# Patient Record
Sex: Female | Born: 1995 | Race: Black or African American | Hispanic: No | Marital: Single | Smoking: Never smoker
Health system: Southern US, Community
[De-identification: ages and names within clinical notes are randomized; demographics above are authoritative.]

## PROBLEM LIST (undated history)

## (undated) ENCOUNTER — Inpatient Hospital Stay (HOSPITAL_COMMUNITY): Payer: Self-pay

## (undated) DIAGNOSIS — Z789 Other specified health status: Secondary | ICD-10-CM

## (undated) DIAGNOSIS — R630 Anorexia: Secondary | ICD-10-CM

## (undated) DIAGNOSIS — K4090 Unilateral inguinal hernia, without obstruction or gangrene, not specified as recurrent: Secondary | ICD-10-CM

## (undated) HISTORY — PX: INDUCED ABORTION: SHX677

---
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
## 2016-01-24 LAB — POCT PREGNANCY, URINE: Preg Test, Ur: NEGATIVE

## 2016-04-03 ENCOUNTER — Encounter (HOSPITAL_COMMUNITY): Payer: Self-pay | Admitting: Emergency Medicine

## 2016-04-03 ENCOUNTER — Emergency Department (HOSPITAL_COMMUNITY)
Admission: EM | Admit: 2016-04-03 | Discharge: 2016-04-03 | Disposition: A | Discharge status: Home / Self Care | Payer: Medicaid Other | Attending: Emergency Medicine | Admitting: Emergency Medicine

## 2016-04-03 ENCOUNTER — Emergency Department (HOSPITAL_COMMUNITY): Payer: Medicaid Other

## 2016-04-03 DIAGNOSIS — M25532 Pain in left wrist: Secondary | ICD-10-CM | POA: Diagnosis present

## 2016-04-03 DIAGNOSIS — Z79899 Other long term (current) drug therapy: Secondary | ICD-10-CM | POA: Insufficient documentation

## 2016-04-03 DIAGNOSIS — M67432 Ganglion, left wrist: Secondary | ICD-10-CM

## 2016-04-03 NOTE — Discharge Instructions (Signed)
Use tylenol or motrin as needed for pain. Use ice or heat to the area as needed to help with pain and swelling. Follow up with the hand specialist in 1-2 weeks as needed for ongoing management of your wrist pain/ganglion cyst. Return to the ER for changes or worsening symptoms.   Ganglion Cyst A ganglion cyst is a noncancerous, fluid-filled lump that occurs near joints or tendons. The ganglion cyst grows out of a joint or the lining of a tendon. It most often develops in the hand or wrist, but it can also develop in the shoulder, elbow, hip, knee, ankle, or foot. The round or oval ganglion cyst can be the size of a pea or larger than a grape. Increased activity may enlarge the size of the cyst because more fluid starts to build up.  CAUSES It is not known what causes a ganglion cyst to grow. However, it may be related to:  Inflammation or irritation around the joint.  An injury.  Repetitive movements or overuse.  Arthritis. RISK FACTORS Risk factors include:  Being a woman.  Being age 29-50. SIGNS AND SYMPTOMS Symptoms may include:   A lump. This most often appears on the hand or wrist, but it can occur in other areas of the body.  Tingling.  Pain.  Numbness.  Muscle weakness.  Weak grip.  Less movement in a joint. DIAGNOSIS Ganglion cysts are most often diagnosed based on a physical exam. Your health care provider will feel the lump and may shine a light alongside it. If it is a ganglion cyst, a light often shines through it. Your health care provider may order an X-ray, ultrasound, or MRI to rule out other conditions. TREATMENT Ganglion cysts usually go away on their own without treatment. If pain or other symptoms are involved, treatment may be needed. Treatment is also needed if the ganglion cyst limits your movement or if it gets infected. Treatment may include:  Wearing a brace or splint on your wrist or finger.  Taking anti-inflammatory medicine.  Draining fluid  from the lump with a needle (aspiration).  Injecting a steroid into the joint.  Surgery to remove the ganglion cyst. HOME CARE INSTRUCTIONS  Do not press on the ganglion cyst, poke it with a needle, or hit it.  Take medicines only as directed by your health care provider.  Wear your brace or splint as directed by your health care provider.  Watch your ganglion cyst for any changes.  Keep all follow-up visits as directed by your health care provider. This is important. SEEK MEDICAL CARE IF:  Your ganglion cyst becomes larger or more painful.  You have increased redness, red streaks, or swelling.  You have pus coming from the lump.  You have weakness or numbness in the affected area.  You have a fever or chills.   This information is not intended to replace advice given to you by your health care provider. Make sure you discuss any questions you have with your health care provider.   Document Released: 09/01/2000 Document Revised: 09/25/2014 Document Reviewed: 02/17/2014 Elsevier Interactive Patient Education 2016 Elsevier Inc[employer]  [employer] Therapy Heat therapy can help ease sore, stiff, injured, and tight muscles and joints. Heat relaxes your muscles, which may help ease your pain.  RISKS AND COMPLICATIONS If you have any of the following conditions, do not use heat therapy unless your health care provider has approved:  Poor circulation.  Healing wounds or scarred skin in the area being treated.  Diabetes,  heart disease, or high blood pressure.  Not being able to feel (numbness) the area being treated.  Unusual swelling of the area being treated.  Active infections.  Blood clots.  Cancer.  Inability to communicate pain. This may include young children and people who have problems with their brain function (dementia).  Pregnancy. Heat therapy should only be used on old, pre-existing, or long-lasting (chronic) injuries. Do not use heat therapy on new injuries unless  directed by your health care provider. HOW TO USE HEAT THERAPY There are several different kinds of heat therapy, including:  Moist heat pack.  Warm water bath.  Hot water bottle.  Electric heating pad.  Heated gel pack.  Heated wrap.  Electric heating pad. Use the heat therapy method suggested by your health care provider. Follow your health care provider's instructions on when and how to use heat therapy. GENERAL HEAT THERAPY RECOMMENDATIONS  Do not sleep while using heat therapy. Only use heat therapy while you are awake.  Your skin may turn pink while using heat therapy. Do not use heat therapy if your skin turns red.  Do not use heat therapy if you have new pain.  High heat or long exposure to heat can cause burns. Be careful when using heat therapy to avoid burning your skin.  Do not use heat therapy on areas of your skin that are already irritated, such as with a rash or sunburn. SEEK MEDICAL CARE IF:  You have blisters, redness, swelling, or numbness.  You have new pain.  Your pain is worse. MAKE SURE YOU:  Understand these instructions.  Will watch your condition.  Will get help right away if you are not doing well or get worse.   This information is not intended to replace advice given to you by your health care provider. Make sure you discuss any questions you have with your health care provider.   Document Released: 11/27/2011 Document Revised: 09/25/2014 Document Reviewed: 10/28/2013 Elsevier Interactive Patient Education 2016 Elsevier Inc.  Cryotherapy Cryotherapy means treatment with cold. Ice or gel packs can be used to reduce both pain and swelling. Ice is the most helpful within the first 24 to 48 hours after an injury or flare-up from overusing a muscle or joint. Sprains, strains, spasms, burning pain, shooting pain, and aches can all be eased with ice. Ice can also be used when recovering from surgery. Ice is effective, has very few side effects,  and is safe for most people to use. PRECAUTIONS  Ice is not a safe treatment option for people with:  Raynaud phenomenon. This is a condition affecting small blood vessels in the extremities. Exposure to cold may cause your problems to return.  Cold hypersensitivity. There are many forms of cold hypersensitivity, including:  Cold urticaria. Red, itchy hives appear on the skin when the tissues begin to warm after being iced.  Cold erythema. This is a red, itchy rash caused by exposure to cold.  Cold hemoglobinuria. Red blood cells break down when the tissues begin to warm after being iced. The hemoglobin that carry oxygen are passed into the urine because they cannot combine with blood proteins fast enough.  Numbness or altered sensitivity in the area being iced. If you have any of the following conditions, do not use ice until you have discussed cryotherapy with your caregiver:  Heart conditions, such as arrhythmia, angina, or chronic heart disease.  High blood pressure.  Healing wounds or open skin in the area being iced.  Current  infections.  Rheumatoid arthritis.  Poor circulation.  Diabetes. Ice slows the blood flow in the region it is applied. This is beneficial when trying to stop inflamed tissues from spreading irritating chemicals to surrounding tissues. However, if you expose your skin to cold temperatures for too long or without the proper protection, you can damage your skin or nerves. Watch for signs of skin damage due to cold. HOME CARE INSTRUCTIONS Follow these tips to use ice and cold packs safely.  Place a dry or damp towel between the ice and skin. A damp towel will cool the skin more quickly, so you may need to shorten the time that the ice is used.  For a more rapid response, add gentle compression to the ice.  Ice for no more than 10 to 20 minutes at a time. The bonier the area you are icing, the less time it will take to get the benefits of ice.  Check  your skin after 5 minutes to make sure there are no signs of a poor response to cold or skin damage.  Rest 20 minutes or more between uses.  Once your skin is numb, you can end your treatment. You can test numbness by very lightly touching your skin. The touch should be so light that you do not see the skin dimple from the pressure of your fingertip. When using ice, most people will feel these normal sensations in this order: cold, burning, aching, and numbness.  Do not use ice on someone who cannot communicate their responses to pain, such as small children or people with dementia. HOW TO MAKE AN ICE PACK Ice packs are the most common way to use ice therapy. Other methods include ice massage, ice baths, and cryosprays. Muscle creams that cause a cold, tingly feeling do not offer the same benefits that ice offers and should not be used as a substitute unless recommended by your caregiver. To make an ice pack, do one of the following:  Place crushed ice or a bag of frozen vegetables in a sealable plastic bag. Squeeze out the excess air. Place this bag inside another plastic bag. Slide the bag into a pillowcase or place a damp towel between your skin and the bag.  Mix 3 parts water with 1 part rubbing alcohol. Freeze the mixture in a sealable plastic bag. When you remove the mixture from the freezer, it will be slushy. Squeeze out the excess air. Place this bag inside another plastic bag. Slide the bag into a pillowcase or place a damp towel between your skin and the bag. SEEK MEDICAL CARE IF:  You develop white spots on your skin. This may give the skin a blotchy (mottled) appearance.  Your skin turns blue or pale.  Your skin becomes waxy or hard.  Your swelling gets worse. MAKE SURE YOU:   Understand these instructions.  Will watch your condition.  Will get help right away if you are not doing well or get worse.   This information is not intended to replace advice given to you by your  health care provider. Make sure you discuss any questions you have with your health care provider.   Document Released: 05/01/2011 Document Revised: 09/25/2014 Document Reviewed: 05/01/2011 Elsevier Interactive Patient Education 2016 Elsevier Inc.   Wrist Pain There are many things that can cause wrist pain. Some common causes include:  An injury to the wrist area.  Overuse of the joint.  A condition that causes too much pressure to be put  on a nerve in the wrist (carpal tunnel syndrome).  Wear and tear of the joints that happens as a person gets older (osteoarthritis).  Other types of arthritis. Sometimes, the cause is not known. The pain often goes away when you follow instructions from your doctor about relieving pain at home. If your wrist pain does not go away, tests may need to be done to find the cause. HOME CARE Pay attention to any changes in your symptoms. Take these actions to help with your pain:  Rest your wrist for at least 48 hours or as told by your doctor.  If your doctor tells you to, put ice on the injured area:  Put ice in a plastic bag.  Place a towel between your skin and the bag.  Leave the ice on for 20 minutes, 2-3 times per day.  Keep your arm raised (elevated) above the level of your heart while you are sitting or lying down.  If a splint or elastic bandage has been put on the injured area:  Wear it as told by your doctor.  Take the splint or bandage off only as told by your doctor.  Loosen the splint or bandage if your fingers lose feeling (are numb) or have a tingling feeling, or if they turn cold or blue.  Take over-the-counter and prescription medicines only as told by your doctor.  Keep all follow-up visits as told by your doctor. This is important. GET HELP IF:  Your pain is not helped by treatment.  Your pain gets worse. GET HELP RIGHT AWAY IF:   Your fingers swell.  Your fingers turn white, very red, or cold and blue.  Your  fingers lose feeling or have a tingling feeling.  You have trouble moving your fingers.   This information is not intended to replace advice given to you by your health care provider. Make sure you discuss any questions you have with your health care provider.   Document Released: 02/21/2008 Document Revised: 05/26/2015 Document Reviewed: 01/20/2015 Elsevier Interactive Patient Education [employer]

## 2016-04-03 NOTE — ED Notes (Signed)
Pt states for the last 2 weeks she has been having left wrist pain and thinks she may have a cyst. Pt has +3 radial pulse with no swelling. Sensation and movement intact.

## 2016-04-03 NOTE — ED Provider Notes (Signed)
CSN: [id number]     Arrival date & time 04/03/16  1358 History  By signing my name below, I, Tanda Rockers, attest that this documentation has been prepared under the direction and in the presence of [name] [name]. Electronically Signed: Tanda Rockers, ED Scribe. 04/03/2016. 5:10 PM.   Chief Complaint  Patient presents with  . Wrist Pain   Patient is a 20 y.o. female presenting with wrist pain. The history is provided by the patient. No language interpreter was used.  Wrist Pain This is a new problem. The current episode started more than 1 week ago. The problem occurs daily. The problem has not changed since onset.Pertinent negatives include no chest pain, no abdominal pain and no shortness of breath. Exacerbated by: movement. Nothing relieves the symptoms. She has tried nothing for the symptoms. The treatment provided no relief.    HPI Comments: Lisa Cook is a 20 y.o. female who is right hand dominant presents to the Emergency Department complaining of gradual onset, intermittent, sharp, 7/10, left wrist pain radiating up left forearm x 2 weeks. Pt states that has felt a nodule in her left wrist for some time now and believes she may have a cyst. The bump has increased in size since onset, but has been stable for several months. The pain is exacerbated with movement. She has not taken anything for the pain. Denies fever, chills, chest pain, shortness of breath, abdominal pain, nausea, vomiting, diarrhea, constipation, dysuria, hematuria, weakness, numbness, tingling, warmth, bruising, skin changes, or any other associated symptoms.   Past Medical History  Diagnosis Date  . Inguinal hernia 11/2012    right  . Decreased appetite 11/29/2012   Past Surgical History  Procedure Laterality Date  . Inguinal hernia repair Right 12/05/2012    Procedure: RIGHT INGUINAL HERNIA REPAIR WITH LAP LOOK ON LEFT SIDE FOR POSSIBLE REPAIR;  Surgeon: Judie Petit. Leonia Corona, MD;  Location: [facility];  Service: Pediatrics;  Laterality: Right;  Rigth inguinal hernia repair with laparoscopic look on left (no hernia)   Family History  Problem Relation Age of Onset  . Asthma Brother   . Sickle cell trait Brother   . Diabetes Maternal Aunt   . Diabetes Maternal Grandmother   . Hypertension Maternal Grandmother   . Heart disease Maternal Grandmother   . Kidney disease Maternal Grandmother     renal failure  . Asthma Maternal Grandmother   . Sickle cell trait Sister    Social History  Substance Use Topics  . Smoking status: Never Smoker   . Smokeless tobacco: Never Used  . Alcohol Use: No   OB History    No data available     Review of Systems  Constitutional: Negative for fever and chills.  Respiratory: Negative for shortness of breath.   Cardiovascular: Negative for chest pain.  Gastrointestinal: Negative for nausea, vomiting, abdominal pain, diarrhea and constipation.  Genitourinary: Negative for dysuria and hematuria.  Musculoskeletal: Positive for arthralgias.       + nodule to left wrist  Skin: Negative for color change.  Allergic/Immunologic: Negative for immunocompromised state.  Neurological: Negative for weakness and numbness.  Psychiatric/Behavioral: Negative for confusion.  A complete 10 system review of systems was obtained and all systems are negative except as noted in the HPI and PMH.   Allergies  Review of patient's allergies indicates no known allergies.  Home Medications   Prior to Admission medications   Medication Sig Start Date End Date Taking? Authorizing Provider  amoxicillin (AMOXIL) 500 MG capsule Take 1 capsule (500 mg total) by mouth 3 (three) times daily. 12/08/14   Linna Hoff, MD  Prenatal Multivit-Min-Fe-FA (PRENATAL VITAMINS) 0.8 MG tablet Take 1 tablet by mouth daily. 10/09/13   Ree Shay, MD   BP 135/72 mmHg  Pulse 76  Temp(Src) 98.5 F (36.9 C)  Resp 20  SpO2 100%  LMP 04/03/2016   Physical Exam   Constitutional: She is oriented to person, place, and time. Vital signs are normal. She appears well-developed and well-nourished.  Non-toxic appearance. No distress.  Afebrile, nontoxic, NAD  HENT:  Head: Normocephalic and atraumatic.  Mouth/Throat: Mucous membranes are normal.  Eyes: Conjunctivae and EOM are normal. Right eye exhibits no discharge. Left eye exhibits no discharge.  Neck: Normal range of motion. Neck supple.  Cardiovascular: Normal rate and intact distal pulses.   Pulmonary/Chest: Effort normal. No respiratory distress.  Abdominal: Normal appearance. She exhibits no distension.  Musculoskeletal: Normal range of motion.       Left wrist: She exhibits tenderness. She exhibits normal range of motion, no bony tenderness, no swelling, no crepitus and no deformity.  Left wrist with FROM intact, with mild tenderness over the dorsum of the hand and wrist, with small ganglion cyst noted in the area of pain, no focal bony TTP, no crepitus or deformity, no swelling, no skin changes, strength and sensation grossly intact, distal pulses intact.  Neurological: She is alert and oriented to person, place, and time. She has normal strength. No sensory deficit.  Skin: Skin is warm, dry and intact. No rash noted.  Psychiatric: She has a normal mood and affect. Her behavior is normal.  Nursing note and vitals reviewed.   ED Course  Procedures (including critical care time)  DIAGNOSTIC STUDIES: Oxygen Saturation is 100% on RA, normal by my interpretation.    COORDINATION OF CARE: 5:00 PM-Discussed treatment plan which includes OTC pain medication and ice/heat with pt at bedside and pt agreed to plan.   Labs Review Labs Reviewed - No data to display  Imaging Review Dg Wrist Complete Left  04/03/2016  CLINICAL DATA:  Left wrist pain for 2 weeks. No known injury. Initial encounter. EXAM: LEFT WRIST - COMPLETE 3+ VIEW COMPARISON:  None. FINDINGS: There is no evidence of fracture or  dislocation. There is no evidence of arthropathy or other focal bone abnormality. Soft tissues are unremarkable. IMPRESSION: Normal exam. Electronically Signed   By: Drusilla Kanner M.D.   On: 04/03/2016 15:14   I have personally reviewed and evaluated these images and lab results as part of my medical decision-making.   EKG Interpretation None      MDM   Final diagnoses:  Left wrist pain  Ganglion cyst of wrist, left    20 y.o. female here with L wrist pain for several weeks, felt a ganglion cyst there that has been present for months. On exam, NVI with soft compartments, small ganglion cyst palpable and this is the location of pain, no other joint line or bony TTP. No skin changes to indicate infection or other etiologies. Likely just ganglion cyst. Discussed RICE, diminishing repetitive movements with wrist, and tylenol/motrin for pain. F/up with hand specialist in 1-2wks for ongoing management. Xray done in triage which was neg. Doubt need for further work up today. I explained the diagnosis and have given explicit precautions to return to the ER including for any other new or worsening symptoms. The patient understands and accepts the medical plan as it's  been dictated and I have answered their questions. Discharge instructions concerning home care and prescriptions have been given. The patient is STABLE and is discharged to home in good condition.   I personally performed the services described in this documentation, which was scribed in my presence. The recorded information has been reviewed and is accurate.  BP 135/72 mmHg  Pulse 76  Temp(Src) 98.5 F (36.9 C)  Resp 20  SpO2 100%  LMP 04/03/2016  No orders of the defined types were placed in this encounter.        [address] CourtMercedes Loachapokaamprubi-Soms, PA-C 04/03/16 1715  Lavera Guiseana Duo Liu, MD 04/04/16 [phone]

## 2016-04-03 NOTE — ED Notes (Signed)
Declined W/C at D/C and was escorted to lobby by RN. 

## 2016-12-16 ENCOUNTER — Encounter (HOSPITAL_COMMUNITY): Payer: Self-pay | Admitting: Emergency Medicine

## 2016-12-16 DIAGNOSIS — J028 Acute pharyngitis due to other specified organisms: Secondary | ICD-10-CM | POA: Insufficient documentation

## 2016-12-16 DIAGNOSIS — E876 Hypokalemia: Secondary | ICD-10-CM | POA: Insufficient documentation

## 2016-12-16 DIAGNOSIS — E860 Dehydration: Secondary | ICD-10-CM | POA: Diagnosis not present

## 2016-12-16 DIAGNOSIS — R509 Fever, unspecified: Secondary | ICD-10-CM | POA: Diagnosis present

## 2016-12-16 DIAGNOSIS — B9689 Other specified bacterial agents as the cause of diseases classified elsewhere: Secondary | ICD-10-CM | POA: Diagnosis not present

## 2016-12-16 MED ORDER — ACETAMINOPHEN 325 MG PO TABS
650.0000 mg | ORAL_TABLET | Freq: Once | ORAL | Status: AC | PRN
Start: 2016-12-16 — End: 2016-12-17
  Administered 2016-12-17: 650 mg via ORAL
  Filled 2016-12-16: qty 2

## 2016-12-16 NOTE — ED Triage Notes (Signed)
Pt reports sore throat and fever for the last 2 days. Pt also reporting pain into right ear. Pt denies taking any OTC medications prior to arrival.

## 2016-12-17 ENCOUNTER — Emergency Department (HOSPITAL_COMMUNITY)
Admission: EM | Admit: 2016-12-17 | Discharge: 2016-12-17 | Disposition: A | Discharge status: Home / Self Care | Payer: Medicaid Other | Attending: Emergency Medicine | Admitting: Emergency Medicine

## 2016-12-17 DIAGNOSIS — J028 Acute pharyngitis due to other specified organisms: Secondary | ICD-10-CM

## 2016-12-17 DIAGNOSIS — E876 Hypokalemia: Secondary | ICD-10-CM

## 2016-12-17 DIAGNOSIS — B9689 Other specified bacterial agents as the cause of diseases classified elsewhere: Secondary | ICD-10-CM

## 2016-12-17 DIAGNOSIS — E860 Dehydration: Secondary | ICD-10-CM

## 2016-12-17 LAB — CBC WITH DIFFERENTIAL/PLATELET
Basophils Absolute: 0 10*3/uL (ref 0.0–0.1)
Basophils Relative: 0 %
Eosinophils Absolute: 0 10*3/uL (ref 0.0–0.7)
Eosinophils Relative: 0 %
HCT: 34.9 % — ABNORMAL LOW (ref 36.0–46.0)
Hemoglobin: 11.3 g/dL — ABNORMAL LOW (ref 12.0–15.0)
Lymphocytes Relative: 10 %
Lymphs Abs: 1.1 10*3/uL (ref 0.7–4.0)
MCH: 27.8 pg (ref 26.0–34.0)
MCHC: 32.4 g/dL (ref 30.0–36.0)
MCV: 85.7 fL (ref 78.0–100.0)
Monocytes Absolute: 0.9 10*3/uL (ref 0.1–1.0)
Monocytes Relative: 8 %
Neutro Abs: 9 10*3/uL — ABNORMAL HIGH (ref 1.7–7.7)
Neutrophils Relative %: 82 %
Platelets: 340 10*3/uL (ref 150–400)
RBC: 4.07 MIL/uL (ref 3.87–5.11)
RDW: 14.1 % (ref 11.5–15.5)
WBC: 11 10*3/uL — ABNORMAL HIGH (ref 4.0–10.5)

## 2016-12-17 LAB — BASIC METABOLIC PANEL
Anion gap: 8 (ref 5–15)
BUN: 9 mg/dL (ref 6–20)
CO2: 24 mmol/L (ref 22–32)
Calcium: 8.9 mg/dL (ref 8.9–10.3)
Chloride: 106 mmol/L (ref 101–111)
Creatinine, Ser: 0.74 mg/dL (ref 0.44–1.00)
GFR calc Af Amer: >60 mL/min (ref >60)
GFR calc non Af Amer: >60 mL/min (ref >60)
Glucose, Bld: 116 mg/dL — ABNORMAL HIGH (ref 65–99)
Potassium: 3.3 mmol/L — ABNORMAL LOW (ref 3.5–5.1)
Sodium: 138 mmol/L (ref 135–145)

## 2016-12-17 LAB — MONONUCLEOSIS SCREEN: Mono Screen: NEGATIVE

## 2016-12-17 LAB — RAPID STREP SCREEN (MED CTR MEBANE ONLY): Streptococcus, Group A Screen (Direct): NEGATIVE

## 2016-12-17 MED ORDER — SODIUM CHLORIDE 0.9 % IV BOLUS (SEPSIS)
1000.0000 mL | INTRAVENOUS | Status: AC
  Administered 2016-12-17: 1000 mL via INTRAVENOUS

## 2016-12-17 MED ORDER — DEXAMETHASONE SODIUM PHOSPHATE 10 MG/ML IJ SOLN
10.0000 mg | Freq: Once | INTRAMUSCULAR | Status: AC
  Administered 2016-12-17: 10 mg via INTRAMUSCULAR
  Filled 2016-12-17: qty 1

## 2016-12-17 MED ORDER — PENICILLIN G BENZATHINE 1200000 UNIT/2ML IM SUSP
1.2000 10*6.[IU] | Freq: Once | INTRAMUSCULAR | Status: AC
Start: 2016-12-17 — End: 2016-12-17
  Administered 2016-12-17: 1.2 10*6.[IU] via INTRAMUSCULAR
  Filled 2016-12-17: qty 2

## 2016-12-17 MED ORDER — KETOROLAC TROMETHAMINE 60 MG/2ML IM SOLN
60.0000 mg | Freq: Once | INTRAMUSCULAR | Status: AC
  Administered 2016-12-17: 60 mg via INTRAMUSCULAR
  Filled 2016-12-17: qty 2

## 2016-12-17 MED ORDER — POTASSIUM CHLORIDE CRYS ER 20 MEQ PO TBCR
40.0000 meq | EXTENDED_RELEASE_TABLET | Freq: Once | ORAL | Status: AC
  Administered 2016-12-17: 40 meq via ORAL
  Filled 2016-12-17: qty 2

## 2016-12-17 NOTE — ED Provider Notes (Signed)
[facility]-EMERGENCY DEPT Provider Note   CSN: [id number] Arrival date & time: 12/16/16  2323   By signing my name below, I, Clovis Pu, attest that this documentation has been prepared under the direction and in the presence of  [employer], PA-C. Electronically Signed: Clovis Pu, ED Scribe. 12/17/16. 1:06 AM.   History   Chief Complaint Chief Complaint  Patient presents with  . Sore Throat  . Fever    HPI Comments:  Lisa Cook is a 21 y.o. female who presents to the Emergency Department complaining of acute onset, persistent sore throat x 2 days. Her sore throat is worse with swallowing. Pt also reports rhinorrhea, chills and subjective fevers. No alleviating factors noted. Pt denies a cough or any other associated symptoms. She also denies a hx of mononucleosis. No other complaints noted. Patient reports somewhat decreased by mouth intake due to pain however able to handle secretions.  The history is provided by the patient and medical records. No language interpreter was used.    Past Medical History:  Diagnosis Date  . Decreased appetite 11/29/2012  . Inguinal hernia 11/2012   right    There are no active problems to display for this patient.   Past Surgical History:  Procedure Laterality Date  . INGUINAL HERNIA REPAIR Right 12/05/2012   Procedure: RIGHT INGUINAL HERNIA REPAIR WITH LAP LOOK ON LEFT SIDE FOR POSSIBLE REPAIR;  Surgeon: Judie Petit. Leonia Corona, MD;  Location: [facility];  Service: Pediatrics;  Laterality: Right;  Rigth inguinal hernia repair with laparoscopic look on left (no hernia)    OB History    No data available       Home Medications    Prior to Admission medications   Not on File    Family History Family History  Problem Relation Age of Onset  . Diabetes Maternal Aunt   . Diabetes Maternal Grandmother   . Hypertension Maternal Grandmother   . Heart disease Maternal Grandmother   . Kidney disease Maternal  Grandmother     renal failure  . Asthma Maternal Grandmother   . Asthma Brother   . Sickle cell trait Brother   . Sickle cell trait Sister     Social History Social History  Substance Use Topics  . Smoking status: Never Smoker  . Smokeless tobacco: Never Used  . Alcohol use No     Allergies   Patient has no known allergies.   Review of Systems Review of Systems  Constitutional: Positive for chills and fever.  HENT: Positive for rhinorrhea and sore throat.   Respiratory: Negative for cough.    Physical Exam Updated Vital Signs BP (!) 109/96 (BP Location: Left Arm)   Pulse (!) 105   Temp (!) 103.1 F (39.5 C) (Oral)   Resp 20   Ht  (1.651 m)   Wt 175 lb (79.4 kg)   LMP 12/16/2016   SpO2 100%   BMI 29.12 kg/m   Physical Exam  Constitutional: She appears well-developed and well-nourished. No distress.  HENT:  Head: Normocephalic and atraumatic.  Right Ear: Tympanic membrane, external ear and ear canal normal.  Left Ear: Tympanic membrane, external ear and ear canal normal.  Nose: Nose normal. No mucosal edema or rhinorrhea.  Mouth/Throat: Uvula is midline and mucous membranes are normal. Mucous membranes are not dry. No trismus in the jaw. No uvula swelling. Oropharyngeal exudate, posterior oropharyngeal edema and posterior oropharyngeal erythema present. No tonsillar abscesses.  Posterior oropharynx with erythema, edema and  exudate on the tonsils  Eyes: Conjunctivae are normal.  Neck: Normal range of motion, full passive range of motion without pain and phonation normal. No tracheal tenderness, no spinous process tenderness and no muscular tenderness present. No neck rigidity. No erythema and normal range of motion present. No Brudzinski's sign and no Kernig's sign noted.  Range of motion without pain  No midline or paraspinal tenderness Normal phonation No stridor Handling secretions without difficulty No nuchal rigidity or meningeal signs  Cardiovascular:  Regular rhythm and normal heart sounds.  Tachycardia present.   Pulses:      Radial pulses are 2+ on the right side, and 2+ on the left side.  Pulmonary/Chest: Effort normal and breath sounds normal. No stridor. No respiratory distress. She has no decreased breath sounds. She has no wheezes.  Equal chest expansion, clear and equal breath sounds without focal wheezes, rhonchi or rales  Musculoskeletal: Normal range of motion.  Lymphadenopathy:       Head (right side): Submandibular and tonsillar adenopathy present. No submental, no preauricular, no posterior auricular and no occipital adenopathy present.       Head (left side): Submandibular and tonsillar adenopathy present. No submental, no preauricular, no posterior auricular and no occipital adenopathy present.    She has cervical adenopathy (bilateral anterior cervical ).       Right cervical: No superficial cervical, no deep cervical and no posterior cervical adenopathy present.      Left cervical: No superficial cervical, no deep cervical and no posterior cervical adenopathy present.  Neurological: She is alert.  Alert and oriented Moves all extremities without ataxia  Skin: Skin is warm and dry. She is not diaphoretic.  Psychiatric: She has a normal mood and affect.  Nursing note and vitals reviewed.    ED Treatments / Results  DIAGNOSTIC STUDIES:  Oxygen Saturation is 100% on RA, normal by my interpretation.    COORDINATION OF CARE:  1:05 AM Discussed treatment plan with pt at bedside and pt agreed to plan.  Labs (all labs ordered are listed, but only abnormal results are displayed) Labs Reviewed  CBC WITH DIFFERENTIAL/PLATELET - Abnormal; Notable for the following:       Result Value   WBC 11.0 (*)    Hemoglobin 11.3 (*)    HCT 34.9 (*)    Neutro Abs 9.0 (*)    All other components within normal limits  BASIC METABOLIC PANEL - Abnormal; Notable for the following:    Potassium 3.3 (*)    Glucose, Bld 116 (*)    All  other components within normal limits  RAPID STREP SCREEN (NOT AT [facility])  CULTURE, GROUP A STREP [facility])  MONONUCLEOSIS SCREEN     Procedures Procedures (including critical care time)  Medications Ordered in ED Medications  acetaminophen (TYLENOL) tablet 650 mg (650 mg Oral Given 12/17/16 0002)  sodium chloride 0.9 % bolus 1,000 mL (0 mLs Intravenous Stopped 12/17/16 0325)  ketorolac (TORADOL) injection 60 mg (60 mg Intramuscular Given 12/17/16 0152)  dexamethasone (DECADRON) injection 10 mg (10 mg Intramuscular Given 12/17/16 0151)  potassium chloride SA (K-DUR,KLOR-CON) CR tablet 40 mEq (40 mEq Oral Given 12/17/16 0311)  penicillin g benzathine (BICILLIN LA) 1200000 UNIT/2ML injection 1.2 Million Units (1.2 Million Units Intramuscular Given 12/17/16 0311)     Initial Impression / Assessment and Plan / ED Course  I have reviewed the triage vital signs and the nursing notes.  Pertinent labs & imaging results that were available during my care of the  patient were reviewed by me and considered in my medical decision making (see chart for details).     Pt rapid strep test negative. Pt is tolerating secretions. Presentation not concerning for peritonsillar abscess or spread of infection to deep spaces of the throat; patent airway. Concern for bacterial pharyngitis. Patient treated with penicillin, Decadron and Toradol. She's had some relief with her pain. She's been able to tolerate fluids here in the emergency room.  Specific return precautions discussed. Recommended PCP follow up. Pt appears safe for discharge.    Final Clinical Impressions(s) / ED Diagnoses   Final diagnoses:  Bacterial pharyngitis  Hypokalemia  Dehydration    New Prescriptions There are no discharge medications for this patient.   I personally performed the services described in this documentation, which was scribed in my presence. The recorded information has been reviewed and is accurate.     Dahlia Client Kimerly Rowand,  PA-C 12/17/16 1610    Tomasita Crumble, MD 12/17/16 [phone]

## 2016-12-17 NOTE — Discharge Instructions (Signed)
1. Medications: ibuprofen and tylenol for fever and pain control, usual home medications 2. Treatment: rest, drink plenty of fluids,  3. Follow Up: Please followup with your primary doctor in 2-3 days for discussion of your diagnoses and further evaluation after today's visit; if you do not have a primary care doctor use the resource guide provided to find one; Please return to the ER for worsening symptoms, lateralizing pain, dehydration or other concerns

## 2016-12-18 ENCOUNTER — Emergency Department (HOSPITAL_COMMUNITY): Payer: Medicaid Other

## 2016-12-18 ENCOUNTER — Emergency Department (HOSPITAL_COMMUNITY)
Admission: EM | Admit: 2016-12-18 | Discharge: 2016-12-19 | Disposition: A | Discharge status: Home / Self Care | Payer: Medicaid Other | Attending: Emergency Medicine | Admitting: Emergency Medicine

## 2016-12-18 ENCOUNTER — Encounter (HOSPITAL_COMMUNITY): Payer: Self-pay | Admitting: Emergency Medicine

## 2016-12-18 DIAGNOSIS — J36 Peritonsillar abscess: Secondary | ICD-10-CM | POA: Insufficient documentation

## 2016-12-18 DIAGNOSIS — J029 Acute pharyngitis, unspecified: Secondary | ICD-10-CM | POA: Diagnosis present

## 2016-12-18 LAB — I-STAT BETA HCG BLOOD, ED (MC, WL, AP ONLY): I-stat hCG, quantitative: <5 m[IU]/mL (ref <5)

## 2016-12-18 LAB — CBC WITH DIFFERENTIAL/PLATELET
Basophils Absolute: 0 10*3/uL (ref 0.0–0.1)
Basophils Relative: 0 %
Eosinophils Absolute: 0 10*3/uL (ref 0.0–0.7)
Eosinophils Relative: 0 %
HCT: 38.3 % (ref 36.0–46.0)
Hemoglobin: 12.2 g/dL (ref 12.0–15.0)
Lymphocytes Relative: 27 %
Lymphs Abs: 3.8 10*3/uL (ref 0.7–4.0)
MCH: 27.9 pg (ref 26.0–34.0)
MCHC: 31.9 g/dL (ref 30.0–36.0)
MCV: 87.4 fL (ref 78.0–100.0)
Monocytes Absolute: 1.1 10*3/uL — ABNORMAL HIGH (ref 0.1–1.0)
Monocytes Relative: 8 %
Neutro Abs: 9 10*3/uL — ABNORMAL HIGH (ref 1.7–7.7)
Neutrophils Relative %: 65 %
Platelets: 409 10*3/uL — ABNORMAL HIGH (ref 150–400)
RBC: 4.38 MIL/uL (ref 3.87–5.11)
RDW: 14.2 % (ref 11.5–15.5)
WBC: 13.9 10*3/uL — ABNORMAL HIGH (ref 4.0–10.5)

## 2016-12-18 LAB — BASIC METABOLIC PANEL
Anion gap: 8 (ref 5–15)
BUN: 11 mg/dL (ref 6–20)
CO2: 25 mmol/L (ref 22–32)
Calcium: 9.1 mg/dL (ref 8.9–10.3)
Chloride: 109 mmol/L (ref 101–111)
Creatinine, Ser: 0.89 mg/dL (ref 0.44–1.00)
GFR calc Af Amer: >60 mL/min (ref >60)
GFR calc non Af Amer: >60 mL/min (ref >60)
Glucose, Bld: 88 mg/dL (ref 65–99)
Potassium: 3.4 mmol/L — ABNORMAL LOW (ref 3.5–5.1)
Sodium: 142 mmol/L (ref 135–145)

## 2016-12-18 MED ORDER — IBUPROFEN 800 MG PO TABS: 800.0000 mg | ORAL_TABLET | Freq: Three times a day (TID) | ORAL | 0 refills | Status: DC

## 2016-12-18 MED ORDER — CLINDAMYCIN HCL 150 MG PO CAPS: 300.0000 mg | ORAL_CAPSULE | Freq: Three times a day (TID) | ORAL | 0 refills | Status: AC

## 2016-12-18 MED ORDER — CLINDAMYCIN PHOSPHATE 600 MG/50ML IV SOLN
600.0000 mg | Freq: Once | INTRAVENOUS | Status: AC
  Administered 2016-12-18: 600 mg via INTRAVENOUS
  Filled 2016-12-18: qty 50

## 2016-12-18 MED ORDER — KETOROLAC TROMETHAMINE 30 MG/ML IJ SOLN
30.0000 mg | Freq: Once | INTRAMUSCULAR | Status: AC
  Administered 2016-12-18: 30 mg via INTRAVENOUS
  Filled 2016-12-18: qty 1

## 2016-12-18 MED ORDER — SODIUM CHLORIDE 0.9 % IV BOLUS (SEPSIS)
1000.0000 mL | Freq: Once | INTRAVENOUS | Status: AC
  Administered 2016-12-18: 1000 mL via INTRAVENOUS

## 2016-12-18 MED ORDER — IOPAMIDOL (ISOVUE-300) INJECTION 61%
75.0000 mL | Freq: Once | INTRAVENOUS | Status: AC | PRN
  Administered 2016-12-18: 75 mL via INTRAVENOUS
  Filled 2016-12-18: qty 75

## 2016-12-18 MED ORDER — HYDROCODONE-ACETAMINOPHEN 7.5-325 MG/15ML PO SOLN: 15.0000 mL | Freq: Four times a day (QID) | ORAL | 0 refills | Status: DC | PRN

## 2016-12-18 MED ORDER — ACETAMINOPHEN 325 MG PO TABS
650.0000 mg | ORAL_TABLET | Freq: Once | ORAL | Status: AC
  Administered 2016-12-18: 650 mg via ORAL
  Filled 2016-12-18: qty 2

## 2016-12-18 MED ORDER — DEXAMETHASONE SODIUM PHOSPHATE 10 MG/ML IJ SOLN
10.0000 mg | Freq: Once | INTRAMUSCULAR | Status: AC
  Administered 2016-12-18: 10 mg via INTRAMUSCULAR
  Filled 2016-12-18: qty 1

## 2016-12-18 MED ORDER — OXYCODONE-ACETAMINOPHEN 5-325 MG PO TABS
2.0000 | ORAL_TABLET | Freq: Once | ORAL | Status: AC
  Administered 2016-12-18: 2 via ORAL
  Filled 2016-12-18: qty 2

## 2016-12-18 NOTE — ED Notes (Signed)
This RN stuck pt 2 times to attempt IV access with no success.

## 2016-12-18 NOTE — ED Provider Notes (Signed)
[facility]-EMERGENCY DEPT Provider Note   CSN: [id number] Arrival date & time: 12/18/16  1639  By signing my name below, I, Cynda Acres, attest that this documentation has been prepared under the direction and in the presence of Sharen Heck, [place]. Electronically Signed: Cynda Acres, ED scribe. 12/18/16. 7:35 PM.  History   Chief Complaint Chief Complaint  Patient presents with  . Sore Throat    HPI Comments: Lisa Cook is a 21 y.o. female with no pertinent medical history, who presents to the Emergency Department complaining of persistent, constant sore throat (worse on the right) that began 3 days ago. Patient was seen in ED two days ago, strep swab and culture negative. She was treated with PCN IM x 1.  Patient notes her symptoms did not improve and have progressively worsened.  Patient reports associated right-sided neck swelling, chills, trouble swallowing, trouble opening jaw, and a headache. No relief with penicillin shot. Patient denies a surgical hx of tonsillectomy. Patient denies any fever, emesis, cough, or rash.  Patient reports minimal relief with tylenol.   The history is provided by the patient. No language interpreter was used.    Past Medical History:  Diagnosis Date  . Decreased appetite 11/29/2012  . Inguinal hernia 11/2012   right    There are no active problems to display for this patient.   Past Surgical History:  Procedure Laterality Date  . INGUINAL HERNIA REPAIR Right 12/05/2012   Procedure: RIGHT INGUINAL HERNIA REPAIR WITH LAP LOOK ON LEFT SIDE FOR POSSIBLE REPAIR;  Surgeon: Judie Petit. Leonia Corona, MD;  Location: [facility];  Service: Pediatrics;  Laterality: Right;  Rigth inguinal hernia repair with laparoscopic look on left (no hernia)    OB History    No data available       Home Medications    Prior to Admission medications   Medication Sig Start Date End Date Taking? Authorizing Provider  clindamycin (CLEOCIN) 150 MG  capsule Take 2 capsules (300 mg total) by mouth 3 (three) times daily. 12/18/16 12/28/16  Liberty Handy, PA-C  HYDROcodone-acetaminophen (HYCET) 7.5-325 mg/15 ml solution Take 15 mLs by mouth 4 (four) times daily as needed for moderate pain. 12/18/16 12/18/17  Liberty Handy, PA-C  ibuprofen (ADVIL,MOTRIN) 800 MG tablet Take 1 tablet (800 mg total) by mouth 3 (three) times daily. 12/18/16   Liberty Handy, PA-C    Family History Family History  Problem Relation Age of Onset  . Diabetes Maternal Aunt   . Diabetes Maternal Grandmother   . Hypertension Maternal Grandmother   . Heart disease Maternal Grandmother   . Kidney disease Maternal Grandmother     renal failure  . Asthma Maternal Grandmother   . Asthma Brother   . Sickle cell trait Brother   . Sickle cell trait Sister     Social History Social History  Substance Use Topics  . Smoking status: Never Smoker  . Smokeless tobacco: Never Used  . Alcohol use No     Allergies   Patient has no known allergies.   Review of Systems Review of Systems  Constitutional: Negative for fever.  HENT: Positive for sore throat and trouble swallowing. Negative for facial swelling.        +Anterior neck swelling +Trismus  Skin: Negative for rash.     Physical Exam Updated Vital Signs BP 138/82 (BP Location: Right Arm)   Pulse 64   Temp 98.3 F (36.8 C) (Oral)   Resp 18   LMP  12/16/2016   SpO2 99%   Physical Exam  Constitutional: She is oriented to person, place, and time. She appears well-developed and well-nourished. She is cooperative. No distress.  HENT:  Head: Normocephalic and atraumatic.  Nose: Nose normal.  Mouth/Throat: Mucous membranes are normal. There is trismus in the jaw. Posterior oropharyngeal edema and posterior oropharyngeal erythema present.  +Right submandibular lymph node tenderness.  +Right anterior neck tenderness, questionable edema of right anterior neck  Posterior oropharynx is difficult to see, I  cannot visualize uvula or tonsils +Trismus No facial edema, erythema or erythema. No sublingual edema or tenderness.  Soft palate flat without tenderness.  No pooling of oral secretions.  Phonation normal, no hot potato voice.  Maxilla and mandible nontender. Mastoids without edema, erythema or tenderness.    Eyes: Conjunctivae and EOM are normal. No scleral icterus.  Cardiovascular: Normal rate, regular rhythm, normal heart sounds and intact distal pulses.   No murmur heard. Pulmonary/Chest: Effort normal and breath sounds normal. She has no wheezes.  Neurological: She is alert and oriented to person, place, and time.  Skin: Skin is warm and dry. Capillary refill takes less than 2 seconds.  Psychiatric: She has a normal mood and affect. Her behavior is normal. Judgment and thought content normal.  Nursing note and vitals reviewed.    ED Treatments / Results  DIAGNOSTIC STUDIES: Oxygen Saturation is 100% on RA, normal by my interpretation.    COORDINATION OF CARE: 7:34 PM Discussed treatment plan with pt at bedside and pt agreed to plan, which includes a neck CT.   Labs (all labs ordered are listed, but only abnormal results are displayed) Labs Reviewed  BASIC METABOLIC PANEL - Abnormal; Notable for the following:       Result Value   Potassium 3.4 (*)    All other components within normal limits  CBC WITH DIFFERENTIAL/PLATELET - Abnormal; Notable for the following:    WBC 13.9 (*)    Platelets 409 (*)    Neutro Abs 9.0 (*)    Monocytes Absolute 1.1 (*)    All other components within normal limits  I-STAT BETA HCG BLOOD, ED ([facility], [facility], [facility] ONLY)    EKG  EKG Interpretation None       Radiology Ct Soft Tissue Neck W Contrast  Result Date: 12/18/2016 CLINICAL DATA:  Initial evaluation for acute sore throat. EXAM: CT NECK WITH CONTRAST TECHNIQUE: Multidetector CT imaging of the neck was performed using the standard protocol following the bolus administration of intravenous  contrast. CONTRAST:  75mL ISOVUE-300 IOPAMIDOL (ISOVUE-300) INJECTION 61% COMPARISON:  None. FINDINGS: Pharynx and larynx: Oral cavity within normal limits. No acute abnormality about the dentition. Palatine tonsils enlarged and hyperenhancing, suggesting acute tonsillitis. Superimposed right the tonsillar/peritonsillar abscess measures 11 x 6 x 16 mm (series 5, image 37). Induration/inflammatory stranding within the adjacent right parapharyngeal fat. Nasopharynx normal. Epiglottis normal. Vallecula clear. Retropharyngeal soft tissues within normal limits. Remainder of the hypopharynx and supraglottic larynx within normal limits. True cords symmetric and normal. Subglottic airway clear. Salivary glands: Salivary glands within normal limits. Thyroid: Thyroid normal. Lymph nodes: Mildly enlarged right level II lymph nodes measure up to 13 mm, likely reactive. Additional mildly prominent right level IV nodes are mildly enlarged up to 13 mm. These may be reactive in nature. Vascular: Normal intravascular enhancement seen throughout the neck. Limited intracranial: Unremarkable. Visualized orbits: Visualized globes and orbital soft tissues within normal limits. Mastoids and visualized paranasal sinuses: Visualized paranasal sinuses and mastoid air cells  are clear. Middle ear cavities are clear. Skeleton: No acute osseous abnormality. No worrisome lytic or blastic osseous lesions. Upper chest: Visualized upper mediastinum within normal limits. Partially visualized lungs are clear. Other: None. IMPRESSION: 1. Findings consistent with acute tonsillitis. Superimposed 11 x 6 x 16 mm right tonsillar/peritonsillar abscess as above. 2. Mildly enlarged right-sided cervical adenopathy is above, likely reactive. Electronically Signed   By: Rise Mu M.D.   On: 12/18/2016 21:27    Procedures Procedures (including critical care time)  Medications Ordered in ED Medications  clindamycin (CLEOCIN) IVPB 600 mg (not  administered)  ketorolac (TORADOL) 30 MG/ML injection 30 mg (not administered)  oxyCODONE-acetaminophen (PERCOCET/ROXICET) 5-325 MG per tablet 2 tablet (not administered)  dexamethasone (DECADRON) injection 10 mg (10 mg Intramuscular Given 12/18/16 1804)  acetaminophen (TYLENOL) tablet 650 mg (650 mg Oral Given 12/18/16 1804)  sodium chloride 0.9 % bolus 1,000 mL (1,000 mLs Intravenous New Bag/Given 12/18/16 2118)  iopamidol (ISOVUE-300) 61 % injection 75 mL (75 mLs Intravenous Contrast Given 12/18/16 2042)     Initial Impression / Assessment and Plan / ED Course  I have reviewed the triage vital signs and the nursing notes.  Pertinent labs & imaging results that were available during my care of the patient were reviewed by me and considered in my medical decision making (see chart for details).  Clinical Course as of Dec 19 2246  Mon Dec 18, 2016  2131 IMPRESSION: 1. Findings consistent with acute tonsillitis. Superimposed 11 x 6 x 16 mm right tonsillar/peritonsillar abscess as above. 2. Mildly enlarged right-sided cervical adenopathy is above, likely reactive. CT Soft Tissue Neck W Contrast [CG]  2233 I-stat hCG, quantitative: <5.0 [CG]  2233 WBC: (!) 13.9 [CG]  2233 Hemoglobin: 12.2 [CG]  2233 Creatinine: 0.89 [CG]  2233 Temp: 98.3 F (36.8 C) [CG]  2233 Pulse Rate: 71 [CG]  2233 BP: 128/76 [CG]  2233 Resp: 19 [CG]  2233 SpO2: 99 % [CG]    Clinical Course User Index [CG] Liberty Handy, PA-C   CT scan showed 11x6x16 mm right tonsillar abscess. +trismus on exam, afebrile, no respiratory distress, patient speaking in full sentences controlling oral secretions. Posterior oropharynx very difficult to visualize. Unable to see uvula or tonsils. Leukocytosis at 13.9. Patient received 1L IVF, tylenol, decadron x 1.  Will consult ENT for recommendations and disposition.   10:30PM: ENT (Dr. Annalee Genta) recommends clindamycin IV x 1 in ED. Discharge with clindamycin, ibuprofen, lortab elixir.   Patient to call ENT clinic if symptoms do not start to improve in 48 hours. Discussed plan with patient and relative at bedside, who agree with plan. Patient given clindamycin, toradol, percocet in ED prior to discharge.   Patient, ED treatment and discharge plan was discussed with supervising physician who is agreeable with plan.   Final Clinical Impressions(s) / ED Diagnoses   Final diagnoses:  Sore throat  Peritonsillar abscess    New Prescriptions New Prescriptions   CLINDAMYCIN (CLEOCIN) 150 MG CAPSULE    Take 2 capsules (300 mg total) by mouth 3 (three) times daily.   HYDROCODONE-ACETAMINOPHEN (HYCET) 7.5-325 MG/15 ML SOLUTION    Take 15 mLs by mouth 4 (four) times daily as needed for moderate pain.   IBUPROFEN (ADVIL,MOTRIN) 800 MG TABLET    Take 1 tablet (800 mg total) by mouth 3 (three) times daily.   I personally performed the services described in this documentation, which was scribed in my presence. The recorded information has been reviewed and is accurate.  Liberty Handy, PA-C 12/18/16 2248    Shaune Pollack, MD 12/20/16 [phone]

## 2016-12-18 NOTE — Discharge Instructions (Signed)
Your CT scan showed a right peritonsillar abscess.  Please take prescribed antibiotics until completion, you may open capsule and pour powder medicine in juice or apple sauce if your throat pain makes it difficult to swallow capsules.  Use Lortab Elixir as prescribed. Take 800 mg ibuprofen every 8 hours.  Drink plenty of cold water to sooth pain and decrease inflammation.   Return to ED if your symptoms worsen, you develop fever, have difficulty breathing or start drooling.   Your symptoms should improve in the next 48 hours.  Contact ENT provider, if your symptoms have not improved in 48 hours.

## 2016-12-18 NOTE — ED Notes (Signed)
Provider at bedside

## 2016-12-18 NOTE — ED Provider Notes (Signed)
MSE was initiated and I personally evaluated the patient and placed orders (if any) at  5:45 PM on December 18, 2016.  The patient appears stable so that the remainder of the MSE may be completed by another provider.  Patient presents to fast track area with continued sore throat after being seen in the emergency department 2 days ago. Patient had a negative rapid strep test in the emergency department. She was treated for bacterial pharyngitis with penicillin in the emergency department. Patient reports her sore throats has continued. She reports it's worse on the right side. She has been drinking, however less than usual. She reports it hurts tremendously to drink or eat. On exam the patient is afebrile nontoxic appearing. She has slight trismus. It is somewhat difficult to visualize her posterior oropharynx. Using a tongue depressor patient does appear to have large tonsils. Concern for possible peritonsillar abscess in the right side. Patient is tolerating her secretions, however she is spitting up her secretions at times. No stridor. No drooling.  Patient was provided with Decadron. Will have him start an IV, give her a fluid bolus and obtain a CT of her neck soft tissues to rule out peritonsillar abscess. We'll move patient to an acute side room.   Vitals:   12/18/16 1701  BP: 109/68  Pulse: 71  Resp: 11  Temp: 98.3 F (36.8 C)  TempSrc: Oral  SpO2: 100%        Everlene Farrier, PA-C 12/18/16 1831    Shaune Pollack, MD 12/20/16 [phone]

## 2016-12-18 NOTE — ED Notes (Signed)
Bed: WA03 Expected date:  Expected time:  Means of arrival:  Comments: Triage 6 

## 2016-12-18 NOTE — ED Triage Notes (Signed)
Pt c/o right facial swelling, sore throat x 3 days, states she was diagnosed with bacterial pharyngitis and given antibiotic injection in ED yesterday, which initially improved symptoms, but now symptoms worsening.

## 2016-12-18 NOTE — ED Notes (Signed)
Family at bedside. 

## 2016-12-18 NOTE — ED Notes (Signed)
Attempted to get an IV, no success; patient needs IV for CT scan

## 2016-12-19 LAB — CULTURE, GROUP A STREP (THRC)

## 2017-02-27 ENCOUNTER — Inpatient Hospital Stay (HOSPITAL_COMMUNITY): Payer: Medicaid Other

## 2017-02-27 ENCOUNTER — Encounter (HOSPITAL_COMMUNITY): Payer: Self-pay | Admitting: Emergency Medicine

## 2017-02-27 ENCOUNTER — Inpatient Hospital Stay (HOSPITAL_COMMUNITY)
Admission: AD | Admit: 2017-02-27 | Discharge: 2017-02-27 | Disposition: A | Discharge status: Home / Self Care | Payer: Medicaid Other | Source: Ambulatory Visit | Attending: Obstetrics and Gynecology | Admitting: Obstetrics and Gynecology

## 2017-02-27 ENCOUNTER — Ambulatory Visit (HOSPITAL_COMMUNITY)
Admission: EM | Admit: 2017-02-27 | Discharge: 2017-02-27 | Disposition: A | Discharge status: Nursing Home (Includes ICF) | Payer: Medicaid Other

## 2017-02-27 ENCOUNTER — Encounter (HOSPITAL_COMMUNITY): Payer: Self-pay | Admitting: *Deleted

## 2017-02-27 DIAGNOSIS — R102 Pelvic and perineal pain: Secondary | ICD-10-CM

## 2017-02-27 DIAGNOSIS — F17200 Nicotine dependence, unspecified, uncomplicated: Secondary | ICD-10-CM | POA: Insufficient documentation

## 2017-02-27 DIAGNOSIS — O99332 Smoking (tobacco) complicating pregnancy, second trimester: Secondary | ICD-10-CM | POA: Insufficient documentation

## 2017-02-27 DIAGNOSIS — N760 Acute vaginitis: Secondary | ICD-10-CM | POA: Diagnosis not present

## 2017-02-27 DIAGNOSIS — O26891 Other specified pregnancy related conditions, first trimester: Secondary | ICD-10-CM | POA: Diagnosis not present

## 2017-02-27 DIAGNOSIS — Z3A01 Less than 8 weeks gestation of pregnancy: Secondary | ICD-10-CM | POA: Diagnosis not present

## 2017-02-27 DIAGNOSIS — B9689 Other specified bacterial agents as the cause of diseases classified elsewhere: Secondary | ICD-10-CM

## 2017-02-27 HISTORY — DX: Other specified health status: Z78.9

## 2017-02-27 LAB — WET PREP, GENITAL
Sperm: NONE SEEN
Trich, Wet Prep: NONE SEEN
Yeast Wet Prep HPF POC: NONE SEEN

## 2017-02-27 LAB — URINALYSIS, ROUTINE W REFLEX MICROSCOPIC
Bilirubin Urine: NEGATIVE
Glucose, UA: NEGATIVE mg/dL
Hgb urine dipstick: NEGATIVE
Ketones, ur: NEGATIVE mg/dL
Nitrite: NEGATIVE
Protein, ur: NEGATIVE mg/dL
Specific Gravity, Urine: 1.025 (ref 1.005–1.030)
pH: 6 (ref 5.0–8.0)

## 2017-02-27 LAB — CBC
HCT: 34.7 % — ABNORMAL LOW (ref 36.0–46.0)
Hemoglobin: 11.4 g/dL — ABNORMAL LOW (ref 12.0–15.0)
MCH: 27.9 pg (ref 26.0–34.0)
MCHC: 32.9 g/dL (ref 30.0–36.0)
MCV: 85 fL (ref 78.0–100.0)
Platelets: 368 10*3/uL (ref 150–400)
RBC: 4.08 MIL/uL (ref 3.87–5.11)
RDW: 14.1 % (ref 11.5–15.5)
WBC: 5.5 10*3/uL (ref 4.0–10.5)

## 2017-02-27 LAB — URINALYSIS, MICROSCOPIC (REFLEX)

## 2017-02-27 LAB — HCG, QUANTITATIVE, PREGNANCY: hCG, Beta Chain, Quant, S: 2185 m[IU]/mL — ABNORMAL HIGH (ref <5)

## 2017-02-27 LAB — POCT PREGNANCY, URINE: Preg Test, Ur: POSITIVE — AB

## 2017-02-27 MED ORDER — METRONIDAZOLE 500 MG PO TABS: 500.0000 mg | ORAL_TABLET | Freq: Two times a day (BID) | ORAL | 0 refills | Status: AC

## 2017-02-27 NOTE — MAU Provider Note (Signed)
Chief Complaint: vaginal burning; Abdominal Cramping; and Possible Pregnancy   First Provider Initiated Contact with Patient 02/27/17 1944        SUBJECTIVE HPI: Lisa Cook is a 21 y.o. G1P0 at [redacted]w[redacted]d by LMP who presents to maternity admissions reporting vaginal irritation and burning which started yesterday.  Also has some lower abdominal cramping.  Was sent here from Urgent care and ED.  Not aware she is pregnant. She denies vaginal bleeding, vaginal itching/burning, urinary symptoms, h/a, dizziness, n/v, or fever/chills.    Abdominal Cramping  This is a new problem. The current episode started yesterday. The onset quality is gradual. The problem occurs intermittently. The problem has been unchanged. The pain is located in the suprapubic region, LLQ and RLQ. The pain is moderate. The quality of the pain is cramping. The abdominal pain does not radiate. Pertinent negatives include no constipation, diarrhea, fever, headaches, myalgias, nausea or vomiting. Nothing aggravates the pain. The pain is relieved by nothing. She has tried nothing for the symptoms.  Possible Pregnancy  This is a new problem. The current episode started today. The problem occurs constantly. The problem has been unchanged. Associated symptoms include abdominal pain. Pertinent negatives include no fever, headaches, myalgias, nausea or vomiting. Nothing aggravates the symptoms. She has tried nothing for the symptoms.   RN Note: +vaginal burning Started yesterday 10/10 pain Watery vaginal discharge--denies odor  +Lower abdominal cramping Going on x1 week Rating pain 5/10  LMP 01/11/17 +HPT  Patient states she just got over a cold and having sinus issues still.   Went to urgent care today for vaginal burning. States was sent here  Past Medical History:  Diagnosis Date  . Decreased appetite 11/29/2012  . Inguinal hernia 11/2012   right  . Medical history non-contributory    Past Surgical History:  Procedure  Laterality Date  . INGUINAL HERNIA REPAIR Right 12/05/2012   Procedure: RIGHT INGUINAL HERNIA REPAIR WITH LAP LOOK ON LEFT SIDE FOR POSSIBLE REPAIR;  Surgeon: Judie Petit. Leonia Corona, MD;  Location: [facility];  Service: Pediatrics;  Laterality: Right;  Rigth inguinal hernia repair with laparoscopic look on left (no hernia)   Social History   Social History  . Marital status: Single    Spouse name: N/A  . Number of children: N/A  . Years of education: N/A   Occupational History  . Not on file.   Social History Main Topics  . Smoking status: Current Some Day Smoker  . Smokeless tobacco: Never Used  . Alcohol use No  . Drug use: No  . Sexual activity: Yes    Birth control/ protection: None   Other Topics Concern  . Not on file   Social History Narrative  . No narrative on file   No current facility-administered medications on file prior to encounter.    No current outpatient prescriptions on file prior to encounter.   No Known Allergies  I have reviewed patient's Past Medical Hx, Surgical Hx, Family Hx, Social Hx, medications and allergies.   ROS:  Review of Systems  Constitutional: Negative for fever.  Gastrointestinal: Positive for abdominal pain. Negative for constipation, diarrhea, nausea and vomiting.  Musculoskeletal: Negative for myalgias.  Neurological: Negative for headaches.   Review of Systems  Other systems negative   Physical Exam  Physical Exam Patient Vitals for the past 24 hrs:  BP Temp Temp src Pulse Resp SpO2 Weight  02/27/17 1821 (!) 147/96 (!) 100.4 F (38 C) Oral 89 18 100 %  168 lb 1.3 oz (76.2 kg)   Constitutional: Well-developed, well-nourished female in no acute distress.  Cardiovascular: normal rate Respiratory: normal effort GI: Abd soft, non-tender. Pos BS x 4 MS: Extremities nontender, no edema, normal ROM Neurologic: Alert and oriented x 4.  GU: Neg CVAT.  PELVIC EXAM: Cervix pink, visually closed, without lesion,  scant white creamy discharge, vaginal walls and external genitalia normal Bimanual exam: Cervix 0/long/high, firm, anterior, neg CMT, uterus nontender, nonenlarged, adnexa without tenderness, enlargement, or mass    LAB RESULTS Results for orders placed or performed during the hospital encounter of 02/27/17 (from the past 24 hour(s))  Urinalysis, Routine w reflex microscopic     Status: Abnormal   Collection Time: 02/27/17  6:21 PM  Result Value Ref Range   Color, Urine YELLOW YELLOW   APPearance HAZY (A) CLEAR   Specific Gravity, Urine 1.025 1.005 - 1.030   pH 6.0 5.0 - 8.0   Glucose, UA NEGATIVE NEGATIVE mg/dL   Hgb urine dipstick NEGATIVE NEGATIVE   Bilirubin Urine NEGATIVE NEGATIVE   Ketones, ur NEGATIVE NEGATIVE mg/dL   Protein, ur NEGATIVE NEGATIVE mg/dL   Nitrite NEGATIVE NEGATIVE   Leukocytes, UA SMALL (A) NEGATIVE  Urinalysis, Microscopic (reflex)     Status: Abnormal   Collection Time: 02/27/17  6:21 PM  Result Value Ref Range   RBC / HPF 0-5 0 - 5 RBC/hpf   WBC, UA 6-30 0 - 5 WBC/hpf   Bacteria, UA FEW (A) NONE SEEN   Squamous Epithelial / LPF 0-5 (A) NONE SEEN   Mucous PRESENT   Pregnancy, urine POC     Status: Abnormal   Collection Time: 02/27/17  6:29 PM  Result Value Ref Range   Preg Test, Ur POSITIVE (A) NEGATIVE  Wet prep, genital     Status: Abnormal   Collection Time: 02/27/17  7:55 PM  Result Value Ref Range   Yeast Wet Prep HPF POC NONE SEEN NONE SEEN   Trich, Wet Prep NONE SEEN NONE SEEN   Clue Cells Wet Prep HPF POC PRESENT (A) NONE SEEN   WBC, Wet Prep HPF POC MODERATE (A) NONE SEEN   Sperm NONE SEEN   CBC     Status: Abnormal   Collection Time: 02/27/17  8:08 PM  Result Value Ref Range   WBC 5.5 4.0 - 10.5 K/uL   RBC 4.08 3.87 - 5.11 MIL/uL   Hemoglobin 11.4 (L) 12.0 - 15.0 g/dL   HCT 16.1 (L) 09.6 - 04.5 %   MCV 85.0 78.0 - 100.0 fL   MCH 27.9 26.0 - 34.0 pg   MCHC 32.9 30.0 - 36.0 g/dL   RDW 40.9 81.1 - 91.4 %   Platelets 368 150 - 400  K/uL    Ref. Range 02/27/2017 20:08  HCG, Beta Chain, Quant, S Latest Ref Range: <5 mIU/mL 2,185 (H)       IMAGING US Ob Comp Less 14 Wks  Result Date: 02/27/2017 CLINICAL DATA:  Cramping for 1 week EXAM: OBSTETRIC <14 WK [place] AND TRANSVAGINAL OB US TECHNIQUE: Both transabdominal and transvaginal ultrasound examinations were performed for complete evaluation of the gestation as well as the maternal uterus, adnexal regions, and pelvic cul-de-sac. Transvaginal technique was performed to assess early pregnancy. COMPARISON:  None. FINDINGS: Intrauterine gestational sac: Probable intrauterine gestational sac. Yolk sac:  Not visualized Embryo:  Not visualized MSD: 7.7  mm   5 w   3  d Maternal uterus/adnexae: Ovaries are within normal limits. The right ovary  measures 3.8 x 1.8 by 2 cm. The left ovary measures 2.7 x 1.7 x 1.6 cm. No free fluid. IMPRESSION: Probable early intrauterine gestational sac, but no yolk sac, fetal pole, or cardiac activity yet visualized. Recommend follow-up quantitative B-HCG levels and follow-up US in 14 days to assess viability. This recommendation follows SRU consensus guidelines: Diagnostic Criteria for Nonviable Pregnancy Early in the First Trimester. Malva Limes Engl J Med 2013; 409:8119-14; 369:1443-51. Electronically Signed   By: Jasmine PangKim  Fujinaga M.D.   On: 02/27/2017 20:46   [place] Ob Transvaginal  Result Date: 02/27/2017 CLINICAL DATA:  Cramping for 1 week EXAM: OBSTETRIC <14 WK US AND TRANSVAGINAL OB US TECHNIQUE: Both transabdominal and transvaginal ultrasound examinations were performed for complete evaluation of the gestation as well as the maternal uterus, adnexal regions, and pelvic cul-de-sac. Transvaginal technique was performed to assess early pregnancy. COMPARISON:  None. FINDINGS: Intrauterine gestational sac: Probable intrauterine gestational sac. Yolk sac:  Not visualized Embryo:  Not visualized MSD: 7.7  mm   5 w   3  d Maternal uterus/adnexae: Ovaries are within normal limits. The right  ovary measures 3.8 x 1.8 by 2 cm. The left ovary measures 2.7 x 1.7 x 1.6 cm. No free fluid. IMPRESSION: Probable early intrauterine gestational sac, but no yolk sac, fetal pole, or cardiac activity yet visualized. Recommend follow-up quantitative B-HCG levels and follow-up US in 14 days to assess viability. This recommendation follows SRU consensus guidelines: Diagnostic Criteria for Nonviable Pregnancy Early in the First Trimester. Malva Limes Engl J Med 2013; 782:9562-13; 369:1443-51. Electronically Signed   By: Jasmine PangKim  Fujinaga M.D.   On: 02/27/2017 20:46     MAU Management/MDM: Ordered usual first trimester r/o ectopic labs.   Pelvic exam and cultures done Will check baseline Ultrasound to rule out ectopic.  This bleeding/pain can represent a normal pregnancy with bleeding, spontaneous abortion or even an ectopic which can be life-threatening.  The process as listed above helps to determine which of these is present.  DIscussed findings. Recommend repeat HCG in 2 days. Then repeat US in 7-10 days. Will treat BV,  Not clear if that is what is causing the burning.   ASSESSMENT 1. Pelvic pain affecting pregnancy in first trimester, antepartum   2. Pelvic pain affecting pregnancy in first trimester, antepartum   3.   Pregnancy of unknown location 4.     Bacterial vaginosis  PLAN Discharge home Plan to repeat HCG level in 48 hours in clinic per 11:00 am schedule Will repeat  Ultrasound in about 7-10 days if HCG levels double appropriately  Ectopic precautions   Pt stable at time of discharge. Encouraged to return here or to other Urgent Care/ED if she develops worsening of symptoms, increase in pain, fever, or other concerning symptoms.    Wynelle BourgeoisMarie Williams CNM, MSN Certified Nurse-Midwife 02/27/2017  8:47 PM

## 2017-02-27 NOTE — Discharge Instructions (Signed)
Abdominal Pain During Pregnancy °Abdominal pain is common in pregnancy. Most of the time, it does not cause harm. There are many causes of abdominal pain. Some causes are more serious than others and sometimes the cause is not known. Abdominal pain can be a sign that something is very wrong with the pregnancy or the pain may have nothing to do with the pregnancy. Always tell your health care provider if you have any abdominal pain. °Follow these instructions at home: °· Do not have sex or put anything in your vagina until your symptoms go away completely. °· Watch your abdominal pain for any changes. °· Get plenty of rest until your pain improves. °· Drink enough fluid to keep your urine clear or pale yellow. °· Take over-the-counter or prescription medicines only as told by your health care provider. °· Keep all follow-up visits as told by your health care provider. This is important. °Contact a health care provider if: °· You have a fever. °· Your pain gets worse or you have cramping. °· Your pain continues after resting. °Get help right away if: °· You are bleeding, leaking fluid, or passing tissue from the vagina. °· You have vomiting or diarrhea that does not go away. °· You have painful or bloody urination. °· You notice a decrease in your baby's movements. °· You feel very weak or faint. °· You have shortness of breath. °· You develop a severe headache with abdominal pain. °· You have abnormal vaginal discharge with abdominal pain. °This information is not intended to replace advice given to you by your health care provider. Make sure you discuss any questions you have with your health care provider. °Document Released: 09/04/2005 Document Revised: 06/15/2016 Document Reviewed: 04/03/2013 °Elsevier Interactive Patient Education © 2018 [employer]. ° °First Trimester of Pregnancy °The first trimester of pregnancy is from week 1 until the end of week 13 (months 1 through 3). A week after a sperm fertilizes an  egg, the egg will implant on the wall of the uterus. This embryo will begin to develop into a baby. Genes from you and your partner will form the baby. The female genes will determine whether the baby will be a boy or a girl. At 6-8 weeks, the eyes and face will be formed, and the heartbeat can be seen on ultrasound. At the end of 12 weeks, all the baby's organs will be formed. °Now that you are pregnant, you will want to do everything you can to have a healthy baby. Two of the most important things are to get good prenatal care and to follow your health care provider's instructions. Prenatal care is all the medical care you receive before the baby's birth. This care will help prevent, find, and treat any problems during the pregnancy and childbirth. °Body changes during your first trimester °Your body goes through many changes during pregnancy. The changes vary from woman to woman. °· You may gain or lose a couple of pounds at first. °· You may feel sick to your stomach (nauseous) and you may throw up (vomit). If the vomiting is uncontrollable, call your health care provider. °· You may tire easily. °· You may develop headaches that can be relieved by medicines. All medicines should be approved by your health care provider. °· You may urinate more often. Painful urination may mean you have a bladder infection. °· You may develop heartburn as a result of your pregnancy. °· You may develop constipation because certain hormones are causing the muscles   that push stool through your intestines to slow down. °· You may develop hemorrhoids or swollen veins (varicose veins). °· Your breasts may begin to grow larger and become tender. Your nipples may stick out more, and the tissue that surrounds them (areola) may become darker. °· Your gums may bleed and may be sensitive to brushing and flossing. °· Dark spots or blotches (chloasma, mask of pregnancy) may develop on your face. This will likely fade after the baby is  born. °· Your menstrual periods will stop. °· You may have a loss of appetite. °· You may develop cravings for certain kinds of food. °· You may have changes in your emotions from day to day, such as being excited to be pregnant or being concerned that something may go wrong with the pregnancy and baby. °· You may have more vivid and strange dreams. °· You may have changes in your hair. These can include thickening of your hair, rapid growth, and changes in texture. Some women also have hair loss during or after pregnancy, or hair that feels dry or thin. Your hair will most likely return to normal after your baby is born. ° °What to expect at prenatal visits °During a routine prenatal visit: °· You will be weighed to make sure you and the baby are growing normally. °· Your blood pressure will be taken. °· Your abdomen will be measured to track your baby's growth. °· The fetal heartbeat will be listened to between weeks 10 and 14 of your pregnancy. °· Test results from any previous visits will be discussed. ° °Your health care provider may ask you: °· How you are feeling. °· If you are feeling the baby move. °· If you have had any abnormal symptoms, such as leaking fluid, bleeding, severe headaches, or abdominal cramping. °· If you are using any tobacco products, including cigarettes, chewing tobacco, and electronic cigarettes. °· If you have any questions. ° °Other tests that may be performed during your first trimester include: °· Blood tests to find your blood type and to check for the presence of any previous infections. The tests will also be used to check for low iron levels (anemia) and protein on red blood cells (Rh antibodies). Depending on your risk factors, or if you previously had diabetes during pregnancy, you may have tests to check for high blood sugar that affects pregnant women (gestational diabetes). °· Urine tests to check for infections, diabetes, or protein in the urine. °· An ultrasound to  confirm the proper growth and development of the baby. °· Fetal screens for spinal cord problems (spina bifida) and Down syndrome. °· HIV (human immunodeficiency virus) testing. Routine prenatal testing includes screening for HIV, unless you choose not to have this test. °· You may need other tests to make sure you and the baby are doing well. ° °Follow these instructions at home: °Medicines °· Follow your health care provider's instructions regarding medicine use. Specific medicines may be either safe or unsafe to take during pregnancy. °· Take a prenatal vitamin that contains at least 600 micrograms (mcg) of folic acid. °· If you develop constipation, try taking a stool softener if your health care provider approves. °Eating and drinking °· Eat a balanced diet that includes fresh fruits and vegetables, whole grains, good sources of protein such as meat, eggs, or tofu, and low-fat dairy. Your health care provider will help you determine the amount of weight gain that is right for you. °· Avoid raw meat and uncooked cheese.   These carry germs that can cause birth defects in the baby. °· Eating four or five small meals rather than three large meals a day may help relieve nausea and vomiting. If you start to feel nauseous, eating a few soda crackers can be helpful. Drinking liquids between meals, instead of during meals, also seems to help ease nausea and vomiting. °· Limit foods that are high in fat and processed sugars, such as fried and sweet foods. °· To prevent constipation: °? Eat foods that are high in fiber, such as fresh fruits and vegetables, whole grains, and beans. °? Drink enough fluid to keep your urine clear or pale yellow. °Activity °· Exercise only as directed by your health care provider. Most women can continue their usual exercise routine during pregnancy. Try to exercise for 30 minutes at least 5 days a week. Exercising will help you: °? Control your weight. °? Stay in shape. °? Be prepared for  labor and delivery. °· Experiencing pain or cramping in the lower abdomen or lower back is a good sign that you should stop exercising. Check with your health care provider before continuing with normal exercises. °· Try to avoid standing for long periods of time. Move your legs often if you must stand in one place for a long time. °· Avoid heavy lifting. °· Wear low-heeled shoes and practice good posture. °· You may continue to have sex unless your health care provider tells you not to. °Relieving pain and discomfort °· Wear a good support bra to relieve breast tenderness. °· Take warm sitz baths to soothe any pain or discomfort caused by hemorrhoids. Use hemorrhoid cream if your health care provider approves. °· Rest with your legs elevated if you have leg cramps or low back pain. °· If you develop varicose veins in your legs, wear support hose. Elevate your feet for 15 minutes, 3-4 times a day. Limit salt in your diet. °Prenatal care °· Schedule your prenatal visits by the twelfth week of pregnancy. They are usually scheduled monthly at first, then more often in the last 2 months before delivery. °· Write down your questions. Take them to your prenatal visits. °· Keep all your prenatal visits as told by your health care provider. This is important. °Safety °· Wear your seat belt at all times when driving. °· Make a list of emergency phone numbers, including numbers for family, friends, the hospital, and police and fire departments. °General instructions °· Ask your health care provider for a referral to a local prenatal education class. Begin classes no later than the beginning of month 6 of your pregnancy. °· Ask for help if you have counseling or nutritional needs during pregnancy. Your health care provider can offer advice or refer you to specialists for help with various needs. °· Do not use hot tubs, steam rooms, or saunas. °· Do not douche or use tampons or scented sanitary pads. °· Do not cross your legs  for long periods of time. °· Avoid cat litter boxes and soil used by cats. These carry germs that can cause birth defects in the baby and possibly loss of the fetus by miscarriage or stillbirth. °· Avoid all smoking, herbs, alcohol, and medicines not prescribed by your health care provider. Chemicals in these products affect the formation and growth of the baby. °· Do not use any products that contain nicotine or tobacco, such as cigarettes and e-cigarettes. If you need help quitting, ask your health care provider. You may receive counseling support and   other resources to help you quit. °· Schedule a dentist appointment. At home, brush your teeth with a soft toothbrush and be gentle when you floss. °Contact a health care provider if: °· You have dizziness. °· You have mild pelvic cramps, pelvic pressure, or nagging pain in the abdominal area. °· You have persistent nausea, vomiting, or diarrhea. °· You have a bad smelling vaginal discharge. °· You have pain when you urinate. °· You notice increased swelling in your face, hands, legs, or ankles. °· You are exposed to fifth disease or chickenpox. °· You are exposed to German measles (rubella) and have never had it. °Get help right away if: °· You have a fever. °· You are leaking fluid from your vagina. °· You have spotting or bleeding from your vagina. °· You have severe abdominal cramping or pain. °· You have rapid weight gain or loss. °· You vomit blood or material that looks like coffee grounds. °· You develop a severe headache. °· You have shortness of breath. °· You have any kind of trauma, such as from a fall or a car accident. °Summary °· The first trimester of pregnancy is from week 1 until the end of week 13 (months 1 through 3). °· Your body goes through many changes during pregnancy. The changes vary from woman to woman. °· You will have routine prenatal visits. During those visits, your health care provider will examine you, discuss any test results you  may have, and talk with you about how you are feeling. °This information is not intended to replace advice given to you by your health care provider. Make sure you discuss any questions you have with your health care provider. °Document Released: 08/29/2001 Document Revised: 08/16/2016 Document Reviewed: 08/16/2016 °Elsevier Interactive Patient Education © 2017 Elsevier Inc. ° °

## 2017-02-27 NOTE — ED Triage Notes (Addendum)
[facility] a [facility] of vaginal itching and burning and lower abdominal pain with dysuria x 3 days.

## 2017-02-27 NOTE — MAU Note (Signed)
+  vaginal burning Started yesterday 10/10 pain Watery vaginal discharge--denies odor  +Lower abdominal cramping Going on x1 week Rating pain 5/10  LMP 01/11/17 +HPT  Patient states she just got over a cold and having sinus issues still.   Went to urgent care today for vaginal burning. States was sent here.

## 2017-02-28 ENCOUNTER — Telehealth (HOSPITAL_COMMUNITY): Payer: Self-pay

## 2017-02-28 ENCOUNTER — Other Ambulatory Visit: Payer: Self-pay | Admitting: Certified Nurse Midwife

## 2017-02-28 ENCOUNTER — Encounter: Payer: Self-pay | Admitting: Advanced Practice Midwife

## 2017-02-28 DIAGNOSIS — O98819 Other maternal infectious and parasitic diseases complicating pregnancy, unspecified trimester: Secondary | ICD-10-CM

## 2017-02-28 DIAGNOSIS — A749 Chlamydial infection, unspecified: Secondary | ICD-10-CM | POA: Insufficient documentation

## 2017-02-28 LAB — HIV ANTIBODY (ROUTINE TESTING W REFLEX): HIV Screen 4th Generation wRfx: NONREACTIVE

## 2017-02-28 LAB — GC/CHLAMYDIA PROBE AMP (~~LOC~~) NOT AT ARMC
Chlamydia: POSITIVE — AB
Neisseria Gonorrhea: NEGATIVE

## 2017-02-28 MED ORDER — AZITHROMYCIN 250 MG PO TABS: 1000.0000 mg | ORAL_TABLET | Freq: Once | ORAL | 0 refills | Status: AC

## 2017-02-28 NOTE — Telephone Encounter (Signed)
Pt called back after receiving message. Verified DOB. Pt aware her lab results during [facility] showed she was positive for Chlamydia. Verified pharmacy with patient. Pt aware a prescription (Azithromycin 1 gram x 1 dose) will be called into her pharmacy for treatment of her positive results. She will take the pills as directed. Patient aware once treatment is received, abstain from intercourse x 2 weeks. Also, she is aware to inform her partner because they will also need to be treated. Communicable Disease Report faxed to health department.

## 2017-02-28 NOTE — Telephone Encounter (Signed)
1st attempt to contact patient regarding patient's STD lab results from her last visit. Left message on patient's voice mail to call back at her convenience. 

## 2017-03-01 ENCOUNTER — Telehealth (HOSPITAL_COMMUNITY): Payer: Self-pay

## 2017-03-01 ENCOUNTER — Encounter (HOSPITAL_COMMUNITY): Payer: Self-pay | Admitting: *Deleted

## 2017-03-01 ENCOUNTER — Telehealth: Payer: Self-pay | Admitting: General Practice

## 2017-03-01 ENCOUNTER — Inpatient Hospital Stay (HOSPITAL_COMMUNITY)
Admission: AD | Admit: 2017-03-01 | Discharge: 2017-03-01 | Disposition: A | Discharge status: Home / Self Care | Payer: Medicaid Other | Source: Ambulatory Visit | Attending: Obstetrics & Gynecology | Admitting: Obstetrics & Gynecology

## 2017-03-01 ENCOUNTER — Ambulatory Visit: Payer: Medicaid Other

## 2017-03-01 DIAGNOSIS — O26891 Other specified pregnancy related conditions, first trimester: Secondary | ICD-10-CM | POA: Diagnosis present

## 2017-03-01 DIAGNOSIS — O98811 Other maternal infectious and parasitic diseases complicating pregnancy, first trimester: Secondary | ICD-10-CM | POA: Insufficient documentation

## 2017-03-01 DIAGNOSIS — R109 Unspecified abdominal pain: Secondary | ICD-10-CM | POA: Diagnosis present

## 2017-03-01 DIAGNOSIS — O039 Complete or unspecified spontaneous abortion without complication: Secondary | ICD-10-CM | POA: Insufficient documentation

## 2017-03-01 DIAGNOSIS — O209 Hemorrhage in early pregnancy, unspecified: Secondary | ICD-10-CM | POA: Diagnosis present

## 2017-03-01 DIAGNOSIS — Z3A01 Less than 8 weeks gestation of pregnancy: Secondary | ICD-10-CM | POA: Insufficient documentation

## 2017-03-01 DIAGNOSIS — O99331 Smoking (tobacco) complicating pregnancy, first trimester: Secondary | ICD-10-CM | POA: Insufficient documentation

## 2017-03-01 DIAGNOSIS — A749 Chlamydial infection, unspecified: Secondary | ICD-10-CM | POA: Diagnosis not present

## 2017-03-01 DIAGNOSIS — F17200 Nicotine dependence, unspecified, uncomplicated: Secondary | ICD-10-CM | POA: Diagnosis not present

## 2017-03-01 DIAGNOSIS — O034 Incomplete spontaneous abortion without complication: Secondary | ICD-10-CM

## 2017-03-01 LAB — URINALYSIS, ROUTINE W REFLEX MICROSCOPIC
Bilirubin Urine: NEGATIVE
Glucose, UA: NEGATIVE mg/dL
Ketones, ur: 20 mg/dL — AB
Nitrite: NEGATIVE
Protein, ur: 100 mg/dL — AB
Specific Gravity, Urine: 1.024 (ref 1.005–1.030)
pH: 5 (ref 5.0–8.0)

## 2017-03-01 LAB — CBC
HCT: 36.7 % (ref 36.0–46.0)
Hemoglobin: 12.2 g/dL (ref 12.0–15.0)
MCH: 27.9 pg (ref 26.0–34.0)
MCHC: 33.2 g/dL (ref 30.0–36.0)
MCV: 84 fL (ref 78.0–100.0)
Platelets: 328 10*3/uL (ref 150–400)
RBC: 4.37 MIL/uL (ref 3.87–5.11)
RDW: 13.9 % (ref 11.5–15.5)
WBC: 7.2 10*3/uL (ref 4.0–10.5)

## 2017-03-01 LAB — HCG, QUANTITATIVE, PREGNANCY: hCG, Beta Chain, Quant, S: 944 m[IU]/mL — ABNORMAL HIGH (ref <5)

## 2017-03-01 MED ORDER — ONDANSETRON 4 MG PO TBDP
4.0000 mg | ORAL_TABLET | Freq: Once | ORAL | Status: AC
  Administered 2017-03-01: 4 mg via ORAL
  Filled 2017-03-01: qty 1

## 2017-03-01 MED ORDER — AZITHROMYCIN 250 MG PO TABS
1000.0000 mg | ORAL_TABLET | Freq: Once | ORAL | Status: AC
  Administered 2017-03-01: 1000 mg via ORAL
  Filled 2017-03-01: qty 4

## 2017-03-01 NOTE — Telephone Encounter (Signed)
Pt called. Verified DOB. Pt states that she "threw up her medicine" and wanted to know "will I be okay?" put pt on hold to look up information. Pt hung up the phone before I could finish looking her information up.

## 2017-03-01 NOTE — Telephone Encounter (Signed)
Pt called from work. Verified Name and DOB. Stated that she threw up her medicine. "there was pink in the throw up." Put pt on hold and went to talk to Wynelle BourgeoisMarie Williams, CNM who had seen her this morning. While speaking with Wynelle BourgeoisMarie Williams, CNM, the pt hung up the phone.

## 2017-03-01 NOTE — Discharge Instructions (Signed)

## 2017-03-01 NOTE — Telephone Encounter (Signed)
Left message for patient on voicemail to give our office a call in regards to lab appointment on 03/08/17 at 11am.

## 2017-03-01 NOTE — MAU Note (Addendum)
About 0630 awoke to abd cramps and vag bleeding. Have used one pad this am. Few small clots in toilet. Was to have labs in clinic at 1100 today but missed appt

## 2017-03-01 NOTE — MAU Provider Note (Signed)
Chief Complaint:  Vaginal Bleeding and Abdominal Cramping  Lisa Cook is  21 y.o. G2P0.  Patient's last menstrual period was 01/11/2017. Her pregnancy status is positive (7wk). She presents with diffuse 9/10 abdominal cramping and vaginal bleeding. The pain woke her up last night. She has been feeling this pain intermittently for 5 days, but it became constant today. This morning she went to the bathroom, noticed heavy bleeding (dark red with small clots), and notes she soaked through a pad in one hour. Was spotting yesterday, but bleeding became heavy today. Endorses nausea, diarrhea.   Obstetrical/Gynecological History: G2P0 (therapeutic abortion) Diagnosed with chlamydia on 6/12 (not yet treated)  Past Medical History: Past Medical History:  Diagnosis Date  . Decreased appetite 11/29/2012  . Inguinal hernia 11/2012   right  . Medical history non-contributory    Past Surgical History: Past Surgical History:  Procedure Laterality Date  . INGUINAL HERNIA REPAIR Right 12/05/2012   Procedure: RIGHT INGUINAL HERNIA REPAIR WITH LAP LOOK ON LEFT SIDE FOR POSSIBLE REPAIR;  Surgeon: Judie PetitM. Leonia CoronaShuaib Farooqui, MD;  Location: [facility];  Service: Pediatrics;  Laterality: Right;  Rigth inguinal hernia repair with laparoscopic look on left (no hernia)   Family History: Family History  Problem Relation Age of Onset  . Diabetes Maternal Aunt   . Diabetes Maternal Grandmother   . Hypertension Maternal Grandmother   . Heart disease Maternal Grandmother   . Kidney disease Maternal Grandmother        renal failure  . Asthma Maternal Grandmother   . Asthma Brother   . Sickle cell trait Brother   . Sickle cell trait Sister    Social History: Social History  Substance Use Topics  . Smoking status: Current Some Day Smoker  . Smokeless tobacco: Never Used  . Alcohol use No   Allergies: No Known Allergies  Prescriptions Prior to Admission  Medication Sig Dispense Refill Last Dose   . metroNIDAZOLE (FLAGYL) 500 MG tablet Take 1 tablet (500 mg total) by mouth 2 (two) times daily. 14 tablet 0 02/28/2017 at Unknown time   Review of Systems (+) abdominal pain (cramping), vaginal bleeding, nausea, diarrhea  (-) headache, vomiting   Physical Exam   BP (!) 143/86 (BP Location: Right Arm)   Pulse (!) 104   Temp 99.7 F (37.6 C)   Resp 18   Ht 5\' 6"  (1.676 m)   Wt 73.9 kg (163 lb)   LMP 01/11/2017   BMI 26.31 kg/m   General: General appearance - alert but slightly uncomfortable-appearing Chest - CTA bilaterally   Heart - RRR; normal S1, S2; no m/r/g Abdomen - soft; non-distended; no increased tenderness to palpation  Focused Gynecological Exam: Limited pelvic exam,  Bleeding has diminished greatly and is now only staining.  No free flow of blood.  Pelvic tenderness is mild.   Labs: Results for orders placed or performed during the hospital encounter of 03/01/17 (from the past 24 hour(s))  Urinalysis, Routine w reflex microscopic   Collection Time: 03/01/17 11:35 AM  Result Value Ref Range   Color, Urine AMBER (A) YELLOW   APPearance HAZY (A) CLEAR   Specific Gravity, Urine 1.024 1.005 - 1.030   pH 5.0 5.0 - 8.0   Glucose, UA NEGATIVE NEGATIVE mg/dL   Hgb urine dipstick LARGE (A) NEGATIVE   Bilirubin Urine NEGATIVE NEGATIVE   Ketones, ur 20 (A) NEGATIVE mg/dL   Protein, ur 478100 (A) NEGATIVE mg/dL   Nitrite NEGATIVE NEGATIVE   Leukocytes,  UA SMALL (A) NEGATIVE   RBC / HPF TOO NUMEROUS TO COUNT 0 - 5 RBC/hpf   WBC, UA TOO NUMEROUS TO COUNT 0 - 5 WBC/hpf   Bacteria, UA RARE (A) NONE SEEN   Squamous Epithelial / LPF 6-30 (A) NONE SEEN   Mucous PRESENT   hCG, quantitative, pregnancy   Collection Time: 03/01/17 12:04 PM  Result Value Ref Range   hCG, Beta Chain, Quant, S 944 (H) <5 mIU/mL  CBC   Collection Time: 03/01/17 12:04 PM  Result Value Ref Range   WBC 7.2 4.0 - 10.5 K/uL   RBC 4.37 3.87 - 5.11 MIL/uL   Hemoglobin 12.2 12.0 - 15.0 g/dL   HCT 13.0  86.5 - 78.4 %   MCV 84.0 78.0 - 100.0 fL   MCH 27.9 26.0 - 34.0 pg   MCHC 33.2 30.0 - 36.0 g/dL   RDW 69.6 29.5 - 28.4 %   Platelets 328 150 - 400 K/uL   Imaging Studies:  US Ob Comp Less 14 Wks  Result Date: 02/27/2017 CLINICAL DATA:  Cramping for 1 week EXAM: OBSTETRIC <14 WK [place] AND TRANSVAGINAL OB US TECHNIQUE: Both transabdominal and transvaginal ultrasound examinations were performed for complete evaluation of the gestation as well as the maternal uterus, adnexal regions, and pelvic cul-de-sac. Transvaginal technique was performed to assess early pregnancy. COMPARISON:  None. FINDINGS: Intrauterine gestational sac: Probable intrauterine gestational sac. Yolk sac:  Not visualized Embryo:  Not visualized MSD: 7.7  mm   5 w   3  d Maternal uterus/adnexae: Ovaries are within normal limits. The right ovary measures 3.8 x 1.8 by 2 cm. The left ovary measures 2.7 x 1.7 x 1.6 cm. No free fluid. IMPRESSION: Probable early intrauterine gestational sac, but no yolk sac, fetal pole, or cardiac activity yet visualized. Recommend follow-up quantitative B-HCG levels and follow-up US in 14 days to assess viability. This recommendation follows SRU consensus guidelines: Diagnostic Criteria for Nonviable Pregnancy Early in the First Trimester. Malva Limes Med 2013; 132:4401-02. Electronically Signed   By: Jasmine Pang M.D.   On: 02/27/2017 20:46   US Ob Transvaginal  Result Date: 02/27/2017 CLINICAL DATA:  Cramping for 1 week EXAM: OBSTETRIC <14 WK [place] AND TRANSVAGINAL OB US TECHNIQUE: Both transabdominal and transvaginal ultrasound examinations were performed for complete evaluation of the gestation as well as the maternal uterus, adnexal regions, and pelvic cul-de-sac. Transvaginal technique was performed to assess early pregnancy. COMPARISON:  None. FINDINGS: Intrauterine gestational sac: Probable intrauterine gestational sac. Yolk sac:  Not visualized Embryo:  Not visualized MSD: 7.7  mm   5 w   3  d Maternal  uterus/adnexae: Ovaries are within normal limits. The right ovary measures 3.8 x 1.8 by 2 cm. The left ovary measures 2.7 x 1.7 x 1.6 cm. No free fluid. IMPRESSION: Probable early intrauterine gestational sac, but no yolk sac, fetal pole, or cardiac activity yet visualized. Recommend follow-up quantitative B-HCG levels and follow-up US in 14 days to assess viability. This recommendation follows SRU consensus guidelines: Diagnostic Criteria for Nonviable Pregnancy Early in the First Trimester. Malva Limes Med 2013; 725:3664-40. Electronically Signed   By: Jasmine Pang M.D.   On: 02/27/2017 20:46   Assessment: - Spontaneous abortion - Chlamydia infection affecting pregnancy   Plan: - Discharge patient in stable condition with strict return precautions for worsening sx - Patient to follow up with outpatient clinic to trend hCG - Azithromycin 1000mg  given  Thurnell Lose, MS3  Discussed  findings and implication that this represents a miscarriage Discussed need to check HCG weekly to zero Work note given RTO next week I confirm that I have verified the information documented in the medical student's note and that I have also personally reperformed the physical exam and all medical decision making activities.

## 2017-03-01 NOTE — Progress Notes (Signed)
Written and verbal d/c instructions given and understanding voiced. 

## 2017-03-08 ENCOUNTER — Other Ambulatory Visit: Payer: Medicaid Other

## 2017-04-14 ENCOUNTER — Ambulatory Visit (HOSPITAL_COMMUNITY)
Admission: EM | Admit: 2017-04-14 | Discharge: 2017-04-14 | Disposition: A | Discharge status: Home / Self Care | Payer: Medicaid Other | Attending: Family Medicine | Admitting: Family Medicine

## 2017-04-14 ENCOUNTER — Encounter (HOSPITAL_COMMUNITY): Payer: Self-pay | Admitting: Emergency Medicine

## 2017-04-14 DIAGNOSIS — A5901 Trichomonal vulvovaginitis: Secondary | ICD-10-CM | POA: Diagnosis not present

## 2017-04-14 DIAGNOSIS — B9689 Other specified bacterial agents as the cause of diseases classified elsewhere: Secondary | ICD-10-CM | POA: Diagnosis not present

## 2017-04-14 DIAGNOSIS — N898 Other specified noninflammatory disorders of vagina: Secondary | ICD-10-CM | POA: Diagnosis present

## 2017-04-14 DIAGNOSIS — Z202 Contact with and (suspected) exposure to infections with a predominantly sexual mode of transmission: Secondary | ICD-10-CM | POA: Insufficient documentation

## 2017-04-14 DIAGNOSIS — N760 Acute vaginitis: Secondary | ICD-10-CM | POA: Diagnosis not present

## 2017-04-14 MED ORDER — FLUCONAZOLE 200 MG PO TABS: ORAL_TABLET | ORAL | 0 refills | Status: DC

## 2017-04-14 NOTE — ED Triage Notes (Signed)
[facility] a complaint of a vaginal discharge and itching x 2 days.

## 2017-04-14 NOTE — ED Provider Notes (Signed)
CSN: [id number]     Arrival date & time 04/14/17  1607 History   None    Chief Complaint  Patient presents with  . Vaginal Discharge   (Consider location/radiation/quality/duration/timing/severity/associated sxs/prior Treatment) Subjective:   Lisa Cook is a 21 y.o. female who presents for evaluation of an abnormal vaginal discharge. Symptoms have been present for 2 days. Vaginal symptoms: local irritation, odor, vulvar itching and "curd like discharge in her panties". Contraception: none. She denies abnormal bleeding, blisters, bumps, burning, dyspareunia, lesions, pain and urinary symptoms of chills, cloudy urine, dysuria, lower abdominal pain, nausea, urinary frequency and urinary urgency Sexually transmitted infection risk: possible STD exposure and was previously evaluated for chlamydia, BV and miscarriage in June . Menstrual flow: regular every 28-30 days.  The following portions of the patient's history were reviewed and updated as appropriate: allergies, current medications, past family history, past medical history, past social history, past surgical history and problem list.   Review of Systems Pertinent items noted in HPI and remainder of comprehensive ROS otherwise negative.   Objective:  BP (!) 139/94 (BP Location: Left Arm)   Pulse 67   Temp 98.3 F (36.8 C) (Oral)   Resp 18   LMP 04/02/2017 (Exact Date)   SpO2 100%   Breastfeeding? Unknown  General appearance: alert, cooperative, appears stated age and no distress Head: Normocephalic, without obvious abnormality, atraumatic Eyes: negative Ears: external ears normal Lungs: clear to auscultation bilaterally Heart: regular rate and rhythm, S1, S2 normal, no murmur, click, rub or gallop Abdomen: soft, non-tender; bowel sounds normal; no masses,  no organomegaly GU: Deferred, urine cytology obtained Extremities: extremities normal, atraumatic, no cyanosis or edema Pulses: 2+ and symmetric Skin: Skin color,  texture, turgor normal. No rashes or lesions Neurologic: Grossly normal        Past Medical History:  Diagnosis Date  . Decreased appetite 11/29/2012  . Inguinal hernia 11/2012   right  . Medical history non-contributory    Past Surgical History:  Procedure Laterality Date  . INGUINAL HERNIA REPAIR Right 12/05/2012   Procedure: RIGHT INGUINAL HERNIA REPAIR WITH LAP LOOK ON LEFT SIDE FOR POSSIBLE REPAIR;  Surgeon: Judie PetitM. Leonia CoronaShuaib Farooqui, MD;  Location: [facility];  Service: Pediatrics;  Laterality: Right;  Rigth inguinal hernia repair with laparoscopic look on left (no hernia)   Family History  Problem Relation Age of Onset  . Diabetes Maternal Aunt   . Diabetes Maternal Grandmother   . Hypertension Maternal Grandmother   . Heart disease Maternal Grandmother   . Kidney disease Maternal Grandmother        renal failure  . Asthma Maternal Grandmother   . Asthma Brother   . Sickle cell trait Brother   . Sickle cell trait Sister    Social History  Substance Use Topics  . Smoking status: Current Some Day Smoker  . Smokeless tobacco: Never Used  . Alcohol use No   OB History    Gravida Para Term Preterm AB Living   2       1     SAB TAB Ectopic Multiple Live Births     1           Review of Systems  Allergies  Patient has no known allergies.  Home Medications   Prior to Admission medications   Medication Sig Start Date End Date Taking? Authorizing Provider  fluconazole (DIFLUCAN) 200 MG tablet Take one tablet today, wait 3 days, take the second tablet 04/14/17  Dorena BodoKennard, Taleya Whitcher, NP   Meds Ordered and Administered this Visit  Medications - No data to display  BP (!) 139/94 (BP Location: Left Arm)   Pulse 67   Temp 98.3 F (36.8 C) (Oral)   Resp 18   LMP 04/02/2017 (Exact Date)   SpO2 100%   Breastfeeding? Unknown  No data found.   Physical Exam  Urgent Care Course     Procedures (including critical care time)  Labs Review Labs Reviewed   URINE CYTOLOGY ANCILLARY ONLY    Imaging Review No results found.      MDM   1. Vaginitis and vulvovaginitis       Assessment:  Yeast Vaginitis.   Plan:  Educational materials distributed. Oral antifungal see orders. Discussed safe sex. Testing for gonorrhea, chlamydia, BV, Trichomonas, and yeast.    Dorena BodoKennard, Elysabeth Aust, NP 04/14/17 1649

## 2017-04-14 NOTE — Discharge Instructions (Signed)
You are being treated for yeast vaginitis. Take one diflucan today, wait three days then take the second tablet. You are being tested for other causes of vaginal discharge as well. If any are positive, we will notify you. Return to clinic as needed.

## 2017-04-16 LAB — URINE CYTOLOGY ANCILLARY ONLY
Chlamydia: NEGATIVE
Neisseria Gonorrhea: NEGATIVE
Trichomonas: POSITIVE — AB

## 2017-04-18 ENCOUNTER — Telehealth (HOSPITAL_COMMUNITY): Payer: Self-pay | Admitting: Internal Medicine

## 2017-04-18 LAB — URINE CYTOLOGY ANCILLARY ONLY: Candida vaginitis: NEGATIVE

## 2017-04-18 MED ORDER — METRONIDAZOLE 500 MG PO TABS: 500.0000 mg | ORAL_TABLET | Freq: Two times a day (BID) | ORAL | 0 refills | Status: DC

## 2017-04-18 NOTE — Telephone Encounter (Signed)
Clinical staff, please let patient know that tests for gardnerella (bacterial vaginosis) and trichomonas were positive.  Will send rx for metronidazole to the pharmacy of record, [facility] on [address].  Recheck for further evaluation if symptoms are not improving.  LM

## 2017-05-03 ENCOUNTER — Emergency Department (HOSPITAL_COMMUNITY)
Admission: EM | Admit: 2017-05-03 | Discharge: 2017-05-03 | Disposition: A | Discharge status: Home / Self Care | Payer: Medicaid Other | Attending: Emergency Medicine | Admitting: Emergency Medicine

## 2017-05-03 ENCOUNTER — Encounter (HOSPITAL_COMMUNITY): Payer: Self-pay | Admitting: Emergency Medicine

## 2017-05-03 DIAGNOSIS — F17210 Nicotine dependence, cigarettes, uncomplicated: Secondary | ICD-10-CM | POA: Diagnosis not present

## 2017-05-03 DIAGNOSIS — J039 Acute tonsillitis, unspecified: Secondary | ICD-10-CM | POA: Diagnosis not present

## 2017-05-03 DIAGNOSIS — J029 Acute pharyngitis, unspecified: Secondary | ICD-10-CM | POA: Diagnosis present

## 2017-05-03 LAB — RAPID STREP SCREEN (MED CTR MEBANE ONLY): Streptococcus, Group A Screen (Direct): NEGATIVE

## 2017-05-03 MED ORDER — CLINDAMYCIN HCL 300 MG PO CAPS: 300.0000 mg | ORAL_CAPSULE | Freq: Four times a day (QID) | ORAL | 0 refills | Status: DC

## 2017-05-03 MED ORDER — CLINDAMYCIN HCL 300 MG PO CAPS
300.0000 mg | ORAL_CAPSULE | Freq: Once | ORAL | Status: AC
  Administered 2017-05-03: 300 mg via ORAL
  Filled 2017-05-03: qty 1

## 2017-05-03 MED ORDER — HYDROCODONE-ACETAMINOPHEN 5-325 MG PO TABS: 2.0000 | ORAL_TABLET | ORAL | 0 refills | Status: DC | PRN

## 2017-05-03 MED ORDER — HYDROCODONE-ACETAMINOPHEN 5-325 MG PO TABS
2.0000 | ORAL_TABLET | Freq: Once | ORAL | Status: AC
  Administered 2017-05-03: 2 via ORAL
  Filled 2017-05-03: qty 2

## 2017-05-03 NOTE — ED Provider Notes (Signed)
[facility]-EMERGENCY DEPT Provider Note   CSN: [id number] Arrival date & time: 05/03/17  1950     History   Chief Complaint Chief Complaint  Patient presents with  . Sore Throat    HPI ENDYA Lisa Cook is a 21 y.o. female.  The history is provided by the patient. No language interpreter was used.  Sore Throat  This is a new problem. The current episode started yesterday. The problem occurs constantly. The problem has been gradually worsening. Nothing aggravates the symptoms. Nothing relieves the symptoms. She has tried nothing for the symptoms. The treatment provided no relief.   Pt complains of a sore throat since yesterday.  Pt reports swelling more to right side.  Pt had an abscess in April.  Pt reports this feels the same.  Past Medical History:  Diagnosis Date  . Decreased appetite 11/29/2012  . Inguinal hernia 11/2012   right  . Medical history non-contributory     Patient Active Problem List   Diagnosis Date Noted  . Chlamydia infection affecting pregnancy 02/28/2017    Past Surgical History:  Procedure Laterality Date  . INGUINAL HERNIA REPAIR Right 12/05/2012   Procedure: RIGHT INGUINAL HERNIA REPAIR WITH LAP LOOK ON LEFT SIDE FOR POSSIBLE REPAIR;  Surgeon: Judie Petit. Leonia Corona, MD;  Location: [facility];  Service: Pediatrics;  Laterality: Right;  Rigth inguinal hernia repair with laparoscopic look on left (no hernia)    OB History    Gravida Para Term Preterm AB Living   2       1     SAB TAB Ectopic Multiple Live Births     1             Home Medications    Prior to Admission medications   Medication Sig Start Date End Date Taking? Authorizing Provider  fluconazole (DIFLUCAN) 200 MG tablet Take one tablet today, wait 3 days, take the second tablet 04/14/17   Dorena Bodo, NP  metroNIDAZOLE (FLAGYL) 500 MG tablet Take 1 tablet (500 mg total) by mouth 2 (two) times daily. 04/18/17   Eustace Moore, MD    Family History Family History    Problem Relation Age of Onset  . Diabetes Maternal Aunt   . Diabetes Maternal Grandmother   . Hypertension Maternal Grandmother   . Heart disease Maternal Grandmother   . Kidney disease Maternal Grandmother        renal failure  . Asthma Maternal Grandmother   . Asthma Brother   . Sickle cell trait Brother   . Sickle cell trait Sister     Social History Social History  Substance Use Topics  . Smoking status: Current Some Day Smoker  . Smokeless tobacco: Never Used  . Alcohol use No     Allergies   Patient has no known allergies.   Review of Systems Review of Systems  All other systems reviewed and are negative.    Physical Exam Updated Vital Signs BP (!) 137/92 (BP Location: Left Arm)   Pulse 96   Temp 98.8 F (37.1 C) (Oral)   Resp 20   Ht 5\' 5"  (1.651 m)   LMP 04/15/2017 (Approximate)   SpO2 100%   Physical Exam  Constitutional: She appears well-developed and well-nourished.  HENT:  Head: Normocephalic and atraumatic.  Right Ear: External ear normal.  Left Ear: External ear normal.  Nose: Nose normal.  Swelling bilat tonsils no obvious abscess,  No palate edema.    Cardiovascular: Normal rate.  Pulmonary/Chest: Effort normal.  Abdominal: Soft.  Musculoskeletal: Normal range of motion.  Neurological: She is alert.  Skin: Skin is warm.  Psychiatric: She has a normal mood and affect.  Nursing note and vitals reviewed.    ED Treatments / Results  Labs (all labs ordered are listed, but only abnormal results are displayed) Labs Reviewed  RAPID STREP SCREEN (NOT AT [facility])  CULTURE, GROUP A STREP [facility])    EKG  EKG Interpretation None       Radiology No results found.  Procedures Procedures (including critical care time)  Medications Ordered in ED Medications - No data to display   Initial Impression / Assessment and Plan / ED Course  I have reviewed the triage vital signs and the nursing notes.  Pertinent labs & imaging results  that were available during my care of the patient were reviewed by me and considered in my medical decision making (see chart for details).   Strep is negative  I counseled pt and family.  Tonsilitis,  No current evidence of peritonsillar abscess.  I will treat with clindamycin.  Pt does not have a primary care MD.  I will have pt recheck at Urgent care tomorrow.  Pt given number to follow up with ENT.    Final Clinical Impressions(s) / ED Diagnoses   Final diagnoses:  Tonsillitis    New Prescriptions New Prescriptions   CLINDAMYCIN (CLEOCIN) 300 MG CAPSULE    Take 1 capsule (300 mg total) by mouth every 6 (six) hours.   HYDROCODONE-ACETAMINOPHEN (NORCO/VICODIN) 5-325 MG TABLET    Take 2 tablets by mouth every 4 (four) hours as needed.   Meds ordered this encounter  Medications  . HYDROcodone-acetaminophen (NORCO/VICODIN) 5-325 MG per tablet 2 tablet  . clindamycin (CLEOCIN) capsule 300 mg  . clindamycin (CLEOCIN) 300 MG capsule    Sig: Take 1 capsule (300 mg total) by mouth every 6 (six) hours.    Dispense:  40 capsule    Refill:  0    Order Specific Question:   Supervising Provider    Answer:   MILLER, BRIAN [[id number]]  . HYDROcodone-acetaminophen (NORCO/VICODIN) 5-325 MG tablet    Sig: Take 2 tablets by mouth every 4 (four) hours as needed.    Dispense:  10 tablet    Refill:  0    Order Specific Question:   Supervising Provider    Answer:   Eber HongMILLER, BRIAN [[id number]]   An After Visit Summary was printed and given to the patient.    Osie CheeksSofia, Leslie K, PA-C 05/03/17 2254    Arby BarrettePfeiffer, Marcy, MD 05/13/17 2039

## 2017-05-03 NOTE — ED Triage Notes (Signed)
Patient c/o sore throat since last night. Reports she has been seen previously for abscess to same area. Reports pain worsens with swallowing. Speaking in full sentences without difficulty. Denies SOB. Maintaining own saliva.

## 2017-05-06 LAB — CULTURE, GROUP A STREP (THRC)

## 2017-12-02 ENCOUNTER — Emergency Department (HOSPITAL_COMMUNITY)
Admission: EM | Admit: 2017-12-02 | Discharge: 2017-12-02 | Disposition: A | Discharge status: Home / Self Care | Payer: Medicaid Other | Attending: Emergency Medicine | Admitting: Emergency Medicine

## 2017-12-02 ENCOUNTER — Other Ambulatory Visit: Payer: Self-pay

## 2017-12-02 ENCOUNTER — Encounter (HOSPITAL_COMMUNITY): Payer: Self-pay | Admitting: Emergency Medicine

## 2017-12-02 DIAGNOSIS — R509 Fever, unspecified: Secondary | ICD-10-CM | POA: Diagnosis present

## 2017-12-02 DIAGNOSIS — Z5321 Procedure and treatment not carried out due to patient leaving prior to being seen by health care provider: Secondary | ICD-10-CM | POA: Insufficient documentation

## 2017-12-02 MED ORDER — ONDANSETRON 4 MG PO TBDP
4.0000 mg | ORAL_TABLET | Freq: Once | ORAL | Status: AC | PRN
  Administered 2017-12-02: 4 mg via ORAL
  Filled 2017-12-02: qty 1

## 2017-12-02 MED ORDER — ACETAMINOPHEN 325 MG PO TABS
650.0000 mg | ORAL_TABLET | Freq: Once | ORAL | Status: AC | PRN
  Administered 2017-12-02: 650 mg via ORAL
  Filled 2017-12-02: qty 2

## 2017-12-02 NOTE — ED Notes (Signed)
Pt's name called x2.  No response. 

## 2017-12-02 NOTE — ED Triage Notes (Signed)
Reports URI symptoms since wed or Thursday as well as elevated temp.  Max of 102.1.  Took tylenol around 7pm.

## 2017-12-09 ENCOUNTER — Ambulatory Visit (HOSPITAL_COMMUNITY)
Admission: EM | Admit: 2017-12-09 | Discharge: 2017-12-09 | Disposition: A | Discharge status: Home / Self Care | Payer: Medicaid Other

## 2017-12-09 ENCOUNTER — Emergency Department (HOSPITAL_COMMUNITY)
Admission: EM | Admit: 2017-12-09 | Discharge: 2017-12-09 | Discharge status: Against Medical Advice | Payer: Medicaid Other | Attending: Emergency Medicine | Admitting: Emergency Medicine

## 2017-12-09 ENCOUNTER — Encounter (HOSPITAL_COMMUNITY): Payer: Self-pay

## 2017-12-09 DIAGNOSIS — Z5321 Procedure and treatment not carried out due to patient leaving prior to being seen by health care provider: Secondary | ICD-10-CM | POA: Insufficient documentation

## 2017-12-09 DIAGNOSIS — R1110 Vomiting, unspecified: Secondary | ICD-10-CM | POA: Diagnosis not present

## 2017-12-09 DIAGNOSIS — R55 Syncope and collapse: Secondary | ICD-10-CM | POA: Insufficient documentation

## 2017-12-09 LAB — URINALYSIS, ROUTINE W REFLEX MICROSCOPIC
Bilirubin Urine: NEGATIVE
Glucose, UA: NEGATIVE mg/dL
Hgb urine dipstick: NEGATIVE
Ketones, ur: NEGATIVE mg/dL
Nitrite: NEGATIVE
Protein, ur: 30 mg/dL — AB
Specific Gravity, Urine: 1.027 (ref 1.005–1.030)
pH: 7 (ref 5.0–8.0)

## 2017-12-09 LAB — COMPREHENSIVE METABOLIC PANEL
ALT: 8 U/L — ABNORMAL LOW (ref 14–54)
AST: 16 U/L (ref 15–41)
Albumin: 3.6 g/dL (ref 3.5–5.0)
Alkaline Phosphatase: 104 U/L (ref 38–126)
Anion gap: 7 (ref 5–15)
BUN: 6 mg/dL (ref 6–20)
CO2: 26 mmol/L (ref 22–32)
Calcium: 8.7 mg/dL — ABNORMAL LOW (ref 8.9–10.3)
Chloride: 106 mmol/L (ref 101–111)
Creatinine, Ser: 0.79 mg/dL (ref 0.44–1.00)
GFR calc Af Amer: >60 mL/min (ref >60)
GFR calc non Af Amer: >60 mL/min (ref >60)
Glucose, Bld: 94 mg/dL (ref 65–99)
Potassium: 3.5 mmol/L (ref 3.5–5.1)
Sodium: 139 mmol/L (ref 135–145)
Total Bilirubin: 0.5 mg/dL (ref 0.3–1.2)
Total Protein: 7.2 g/dL (ref 6.5–8.1)

## 2017-12-09 LAB — CBC
HCT: 36.2 % (ref 36.0–46.0)
Hemoglobin: 11.6 g/dL — ABNORMAL LOW (ref 12.0–15.0)
MCH: 28.7 pg (ref 26.0–34.0)
MCHC: 32 g/dL (ref 30.0–36.0)
MCV: 89.6 fL (ref 78.0–100.0)
Platelets: 354 10*3/uL (ref 150–400)
RBC: 4.04 MIL/uL (ref 3.87–5.11)
RDW: 13.8 % (ref 11.5–15.5)
WBC: 8 10*3/uL (ref 4.0–10.5)

## 2017-12-09 LAB — LIPASE, BLOOD: Lipase: 22 U/L (ref 11–51)

## 2017-12-09 LAB — I-STAT BETA HCG BLOOD, ED (MC, WL, AP ONLY): I-stat hCG, quantitative: <5 m[IU]/mL (ref <5)

## 2017-12-09 NOTE — ED Notes (Signed)
EMT called pt x3 to take pt to the room, no answer

## 2017-12-09 NOTE — ED Triage Notes (Signed)
Pt decided to go to the ER.

## 2017-12-09 NOTE — ED Notes (Signed)
Called for room x 3. No response °

## 2017-12-09 NOTE — ED Triage Notes (Signed)
Patient complains of near syncopal [place] and this am awoke with 1 episode of vomiting. Alert and oriented, NAD. Denies any additional abd. cramping

## 2018-04-04 ENCOUNTER — Encounter (HOSPITAL_COMMUNITY): Payer: Self-pay | Admitting: Emergency Medicine

## 2018-04-04 ENCOUNTER — Ambulatory Visit (HOSPITAL_COMMUNITY)
Admission: EM | Admit: 2018-04-04 | Discharge: 2018-04-04 | Disposition: A | Discharge status: Home / Self Care | Payer: Medicaid Other | Attending: Family Medicine | Admitting: Family Medicine

## 2018-04-04 DIAGNOSIS — N760 Acute vaginitis: Secondary | ICD-10-CM

## 2018-04-04 DIAGNOSIS — Z841 Family history of disorders of kidney and ureter: Secondary | ICD-10-CM | POA: Diagnosis not present

## 2018-04-04 DIAGNOSIS — Z9889 Other specified postprocedural states: Secondary | ICD-10-CM | POA: Diagnosis not present

## 2018-04-04 DIAGNOSIS — Z832 Family history of diseases of the blood and blood-forming organs and certain disorders involving the immune mechanism: Secondary | ICD-10-CM | POA: Insufficient documentation

## 2018-04-04 DIAGNOSIS — Z833 Family history of diabetes mellitus: Secondary | ICD-10-CM | POA: Insufficient documentation

## 2018-04-04 DIAGNOSIS — Z79899 Other long term (current) drug therapy: Secondary | ICD-10-CM | POA: Diagnosis not present

## 2018-04-04 DIAGNOSIS — Z792 Long term (current) use of antibiotics: Secondary | ICD-10-CM | POA: Diagnosis not present

## 2018-04-04 DIAGNOSIS — B9689 Other specified bacterial agents as the cause of diseases classified elsewhere: Secondary | ICD-10-CM | POA: Diagnosis not present

## 2018-04-04 DIAGNOSIS — Z8249 Family history of ischemic heart disease and other diseases of the circulatory system: Secondary | ICD-10-CM | POA: Diagnosis not present

## 2018-04-04 DIAGNOSIS — Z825 Family history of asthma and other chronic lower respiratory diseases: Secondary | ICD-10-CM | POA: Diagnosis not present

## 2018-04-04 MED ORDER — FLUCONAZOLE 150 MG PO TABS: 150.0000 mg | ORAL_TABLET | Freq: Every day | ORAL | 0 refills | Status: DC

## 2018-04-04 MED ORDER — METRONIDAZOLE 500 MG PO TABS: 500.0000 mg | ORAL_TABLET | Freq: Two times a day (BID) | ORAL | 0 refills | Status: DC

## 2018-04-04 NOTE — ED Provider Notes (Signed)
[facility]    CSN: [id number] Arrival date & time: 04/04/18  1636     History   Chief Complaint Chief Complaint  Patient presents with  . Vaginal Discharge    HPI Lisa Cook is a 22 y.o. female.   HPI  Patient is here for "recurring BV".  She has noticed vaginal irritation and odor.  No dramatic itching.  No discharge.  No abdominal pain or pelvic pain.  No change in menses.  No urinary symptoms, dysuria, frequency.  She would like treatment for BV.  She states when she takes the treatment she gets yeast infection.  She would like treatment for this as well.  She has had unprotected sexual relations.  She would like testing for STD.  Specifically denies my recommendation for blood work such as HIV or RPR.  Past Medical History:  Diagnosis Date  . Decreased appetite 11/29/2012  . Inguinal hernia 11/2012   right  . Medical history non-contributory     Patient Active Problem List   Diagnosis Date Noted  . Chlamydia infection affecting pregnancy 02/28/2017    Past Surgical History:  Procedure Laterality Date  . INGUINAL HERNIA REPAIR Right 12/05/2012   Procedure: RIGHT INGUINAL HERNIA REPAIR WITH LAP LOOK ON LEFT SIDE FOR POSSIBLE REPAIR;  Surgeon: Judie PetitM. Leonia CoronaShuaib Farooqui, MD;  Location: [facility];  Service: Pediatrics;  Laterality: Right;  Rigth inguinal hernia repair with laparoscopic look on left (no hernia)    OB History    Gravida  2   Para      Term      Preterm      AB  1   Living        SAB      TAB  1   Ectopic      Multiple      Live Births               Home Medications    Prior to Admission medications   Medication Sig Start Date End Date Taking? Authorizing Provider  fluconazole (DIFLUCAN) 150 MG tablet Take 1 tablet (150 mg total) by mouth daily. Repeat in 1 week if needed 04/04/18   Eustace MooreNelson, Malasha Kleppe Sue, MD  metroNIDAZOLE (FLAGYL) 500 MG tablet Take 1 tablet (500 mg total) by mouth 2 (two) times daily.  04/04/18   Eustace MooreNelson, Galena Logie Sue, MD    Family History Family History  Problem Relation Age of Onset  . Diabetes Maternal Aunt   . Diabetes Maternal Grandmother   . Hypertension Maternal Grandmother   . Heart disease Maternal Grandmother   . Kidney disease Maternal Grandmother        renal failure  . Asthma Maternal Grandmother   . Asthma Brother   . Sickle cell trait Brother   . Sickle cell trait Sister     Social History Social History   Tobacco Use  . Smoking status: Current Some Day Smoker  . Smokeless tobacco: Never Used  Substance Use Topics  . Alcohol use: No  . Drug use: No     Allergies   Patient has no known allergies.   Review of Systems Review of Systems  Constitutional: Negative for chills and fever.  HENT: Negative for ear pain and sore throat.   Eyes: Negative for pain and visual disturbance.  Respiratory: Negative for cough and shortness of breath.   Cardiovascular: Negative for chest pain and palpitations.  Gastrointestinal: Negative for abdominal pain and vomiting.  Genitourinary: Positive  for vaginal discharge. Negative for dysuria and hematuria.  Musculoskeletal: Negative for arthralgias and back pain.  Skin: Negative for color change and rash.  Neurological: Negative for seizures and syncope.  All other systems reviewed and are negative.    Physical Exam Triage Vital Signs ED Triage Vitals [04/04/18 1646]  Enc Vitals Group     BP (!) 107/51     Pulse Rate 60     Resp 18     Temp 98.4 F (36.9 C)     Temp Source Oral     SpO2 99 %     Weight      Height      Head Circumference      Peak Flow      Pain Score      Pain Loc      Pain Edu?      Excl. in GC?    No data found.  Updated Vital Signs BP (!) 107/51 (BP Location: Left Arm)   Pulse 60   Temp 98.4 F (36.9 C) (Oral)   Resp 18   SpO2 99%   Visual Acuity Right Eye Distance:   Left Eye Distance:   Bilateral Distance:    Right Eye Near:   Left Eye Near:      Bilateral Near:     Physical Exam  Constitutional: She appears well-developed and well-nourished. No distress.  HENT:  Head: Normocephalic and atraumatic.  Mouth/Throat: Oropharynx is clear and moist.  Eyes: Pupils are equal, round, and reactive to light. Conjunctivae are normal.  Neck: Normal range of motion.  Cardiovascular: Normal rate.  Pulmonary/Chest: Effort normal. No respiratory distress.  Abdominal: Soft. She exhibits no distension.  Musculoskeletal: Normal range of motion. She exhibits no edema.  Neurological: She is alert.  Skin: Skin is warm and dry.  Psychiatric: She has a normal mood and affect. Her behavior is normal.     UC Treatments / Results  Labs (all labs ordered are listed, but only abnormal results are displayed) Labs Reviewed  CERVICOVAGINAL ANCILLARY ONLY    EKG None  Radiology No results found.  Procedures Procedures (including critical care time)  Medications Ordered in UC Medications - No data to display  Initial Impression / Assessment and Plan / UC Course  I have reviewed the triage vital signs and the nursing notes.  Pertinent labs & imaging results that were available during my care of the patient were reviewed by me and considered in my medical decision making (see chart for details).     *Discussed BV.  Recurring BV.  Trial of probiotics. Final Clinical Impressions(s) / UC Diagnoses   Final diagnoses:  BV (bacterial vaginosis)     Discharge Instructions     Take metronidazole 500 mg p.o. twice daily for the bacterial vaginitis Do not drink alcohol while on metronidazole Avoid sexual relations for 7 days after treatment to make sure the infection is gone After metronidazole take the Diflucan tablet to prevent yeast infection We did lab testing during this visit.  If there are any abnormal findings that require change in medicine or indicate a positive result, you will be notified.  If all of your tests are normal, you will  not be called.      ED Prescriptions    Medication Sig Dispense Auth. Provider   fluconazole (DIFLUCAN) 150 MG tablet Take 1 tablet (150 mg total) by mouth daily. Repeat in 1 week if needed 2 tablet Eustace Moore, MD   metroNIDAZOLE (  FLAGYL) 500 MG tablet Take 1 tablet (500 mg total) by mouth 2 (two) times daily. 14 tablet Eustace Moore, MD     Controlled Substance Prescriptions [place] Controlled Substance Registry consulted? Not Applicable   Eustace Moore, MD 04/04/18 2139

## 2018-04-04 NOTE — Discharge Instructions (Signed)
Take metronidazole 500 mg p.o. twice daily for the bacterial vaginitis Do not drink alcohol while on metronidazole Avoid sexual relations for 7 days after treatment to make sure the infection is gone After metronidazole take the Diflucan tablet to prevent yeast infection We did lab testing during this visit.  If there are any abnormal findings that require change in medicine or indicate a positive result, you will be notified.  If all of your tests are normal, you will not be called.

## 2018-04-04 NOTE — ED Triage Notes (Signed)
Pt sts vaginal discharge and irritation 

## 2018-04-05 LAB — CERVICOVAGINAL ANCILLARY ONLY
Chlamydia: POSITIVE — AB
Neisseria Gonorrhea: NEGATIVE
Trichomonas: POSITIVE — AB

## 2018-04-05 LAB — POCT URINALYSIS DIP (DEVICE)
Bilirubin Urine: NEGATIVE
Glucose, UA: NEGATIVE mg/dL
Hgb urine dipstick: NEGATIVE
Leukocytes, UA: NEGATIVE
Nitrite: NEGATIVE
Protein, ur: 30 mg/dL — AB
Specific Gravity, Urine: 1.025 (ref 1.005–1.030)
Urobilinogen, UA: 0.2 mg/dL (ref 0.0–1.0)
pH: 6.5 (ref 5.0–8.0)

## 2018-04-09 ENCOUNTER — Telehealth (HOSPITAL_COMMUNITY): Payer: Self-pay

## 2018-04-09 NOTE — Telephone Encounter (Signed)
Attempted to reach patient x 2 

## 2018-11-03 ENCOUNTER — Emergency Department (HOSPITAL_COMMUNITY)
Admission: EM | Admit: 2018-11-03 | Discharge: 2018-11-03 | Disposition: A | Discharge status: Home / Self Care | Payer: Medicaid Other | Attending: Emergency Medicine | Admitting: Emergency Medicine

## 2018-11-03 ENCOUNTER — Encounter (HOSPITAL_COMMUNITY): Payer: Self-pay

## 2018-11-03 DIAGNOSIS — F17200 Nicotine dependence, unspecified, uncomplicated: Secondary | ICD-10-CM | POA: Insufficient documentation

## 2018-11-03 DIAGNOSIS — L0231 Cutaneous abscess of buttock: Secondary | ICD-10-CM | POA: Insufficient documentation

## 2018-11-03 DIAGNOSIS — L0291 Cutaneous abscess, unspecified: Secondary | ICD-10-CM

## 2018-11-03 MED ORDER — SULFAMETHOXAZOLE-TRIMETHOPRIM 800-160 MG PO TABS: 1.0000 | ORAL_TABLET | Freq: Two times a day (BID) | ORAL | 0 refills | Status: AC

## 2018-11-03 NOTE — Discharge Instructions (Addendum)
Take antibiotics as prescribed and complete the full course. Warm compresses for 20 minutes 3 times daily. DO NOT try to drain on your own at home. With antibiotics and compresses, the infection may go away OR it may open and drain on its own OR it may become soft and ready to be drained. Recheck with your doctor in 2 days.

## 2018-11-03 NOTE — ED Notes (Signed)
Patient verbalized understanding of dc instructions, vss, ambulatory with nad.   

## 2018-11-03 NOTE — ED Provider Notes (Signed)
[facility] EMERGENCY DEPARTMENT Provider Note   CSN: [id number] Arrival date & time: 11/03/18  4098     History   Chief Complaint Chief Complaint  Patient presents with  . Abscess    HPI Lisa Cook is a 23 y.o. female.  23 year old female presents with complaint of an abscess to her right buttock.  Patient reports area has been there for the past week, she has tried to pop it at home without success.  Denies drainage from area currently.  No other complaints or concerns.  History and physical exam completed with patient's mother on video phone, patient did not want to hang up the phone for privacy.     Past Medical History:  Diagnosis Date  . Decreased appetite 11/29/2012  . Inguinal hernia 11/2012   right  . Medical history non-contributory     Patient Active Problem List   Diagnosis Date Noted  . Chlamydia infection affecting pregnancy 02/28/2017    Past Surgical History:  Procedure Laterality Date  . INGUINAL HERNIA REPAIR Right 12/05/2012   Procedure: RIGHT INGUINAL HERNIA REPAIR WITH LAP LOOK ON LEFT SIDE FOR POSSIBLE REPAIR;  Surgeon: Judie Petit. Leonia Corona, MD;  Location: [facility];  Service: Pediatrics;  Laterality: Right;  Rigth inguinal hernia repair with laparoscopic look on left (no hernia)     OB History    Gravida  2   Para      Term      Preterm      AB  1   Living        SAB      TAB  1   Ectopic      Multiple      Live Births               Home Medications    Prior to Admission medications   Medication Sig Start Date End Date Taking? Authorizing Provider  sulfamethoxazole-trimethoprim (BACTRIM DS,SEPTRA DS) 800-160 MG tablet Take 1 tablet by mouth 2 (two) times daily for 10 days. 11/03/18 11/13/18  Jeannie Fend, PA-C    Family History Family History  Problem Relation Age of Onset  . Diabetes Maternal Aunt   . Diabetes Maternal Grandmother   . Hypertension Maternal Grandmother   .  Heart disease Maternal Grandmother   . Kidney disease Maternal Grandmother        renal failure  . Asthma Maternal Grandmother   . Asthma Brother   . Sickle cell trait Brother   . Sickle cell trait Sister     Social History Social History   Tobacco Use  . Smoking status: Current Some Day Smoker  . Smokeless tobacco: Never Used  Substance Use Topics  . Alcohol use: No  . Drug use: No     Allergies   Patient has no known allergies.   Review of Systems Review of Systems  Constitutional: Negative for fever.  Musculoskeletal: Negative for arthralgias and joint swelling.  Skin: Negative for color change, rash and wound.  Allergic/Immunologic: Negative for immunocompromised state.  Hematological: Negative for adenopathy.  Psychiatric/Behavioral: Negative for confusion.  All other systems reviewed and are negative.    Physical Exam Updated Vital Signs BP (!) 145/84 (BP Location: Right Arm)   Pulse 69   Temp 98.7 F (37.1 C) (Oral)   Resp 18   LMP 10/29/2018 (Exact Date)   SpO2 100%   Physical Exam Vitals signs and nursing note reviewed.  Constitutional:  General: She is not in acute distress.    Appearance: She is well-developed. She is not diaphoretic.  HENT:     Head: Normocephalic and atraumatic.  Pulmonary:     Effort: Pulmonary effort is normal.  Skin:    General: Skin is warm and dry.       Neurological:     Mental Status: She is alert and oriented to person, place, and time.  Psychiatric:        Behavior: Behavior normal.      ED Treatments / Results  Labs (all labs ordered are listed, but only abnormal results are displayed) Labs Reviewed - No data to display  EKG None  Radiology No results found.  Procedures Procedures (including critical care time)  Medications Ordered in ED Medications - No data to display   Initial Impression / Assessment and Plan / ED Course  I have reviewed the triage vital signs and the nursing  notes.  Pertinent labs & imaging results that were available during my care of the patient were reviewed by me and considered in my medical decision making (see chart for details).  Clinical Course as of Nov 03 904  Wynelle Link Nov 03, 2018  1638 23 year old female with early abscess to right buttocks.  There is no fluctuance or drainage at this time.  Advised patient to take antibiotics and apply warm compresses.  Informed patient area may open and drain on its own, may need to be drained or may resolve with provided treatment.  To recheck with primary care in 2 days.   [LM]    Clinical Course User Index [LM] Jeannie Fend, PA-C   Final Clinical Impressions(s) / ED Diagnoses   Final diagnoses:  Abscess    ED Discharge Orders         Ordered    sulfamethoxazole-trimethoprim (BACTRIM DS,SEPTRA DS) 800-160 MG tablet  2 times daily     11/03/18 0900           Jeannie Fend, PA-C 11/03/18 0906    Virgina Norfolk, DO 11/03/18 [phone]

## 2018-11-03 NOTE — ED Triage Notes (Signed)
Pt presents for evaluation of abscess to R buttocks. Reports hx of similar 6 years ago. Pt reports this started a week ago, some drainage noted. No fevers.

## 2018-12-04 IMAGING — US US OB COMP LESS 14 WK
1 series · 15 of 28 positions shown · non-contrast
Comparison: None.

CLINICAL DATA: Cramping for 1 week

EXAM:
OBSTETRIC <14 WK US AND TRANSVAGINAL OB US
TECHNIQUE: Both transabdominal and transvaginal ultrasound examinations were
performed for complete evaluation of the gestation as well as the
maternal uterus, adnexal regions, and pelvic cul-de-sac.
Transvaginal technique was performed to assess early pregnancy.

[Series 1: us ob comp less 14 wk · 15 of 55 slices shown]
[im 1/55]
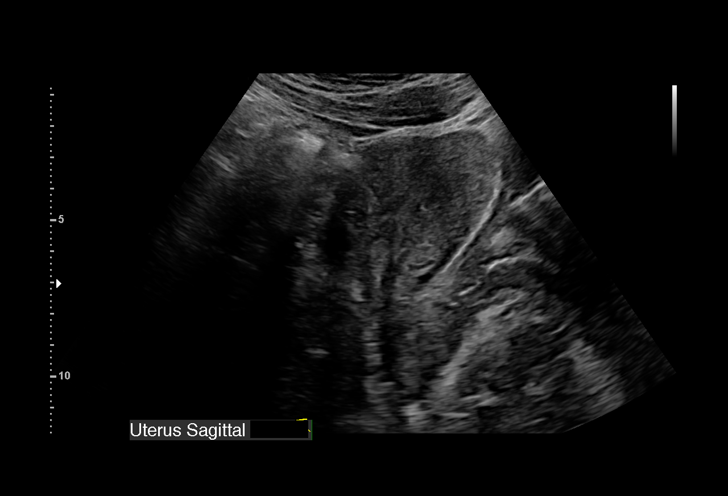
[im 5/55]
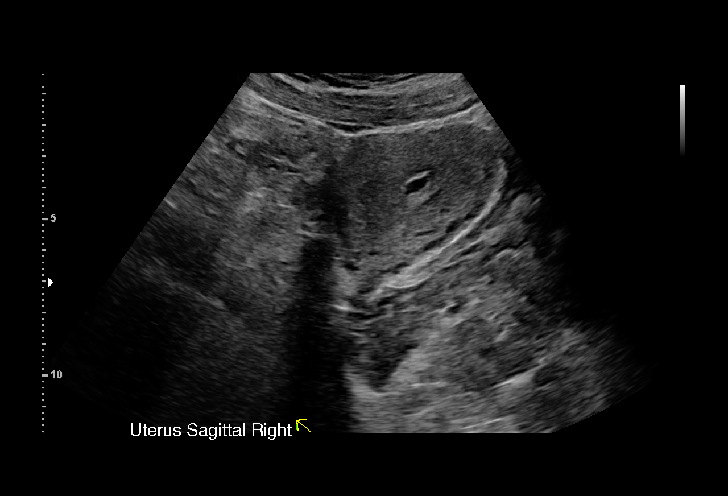
[im 9/55]
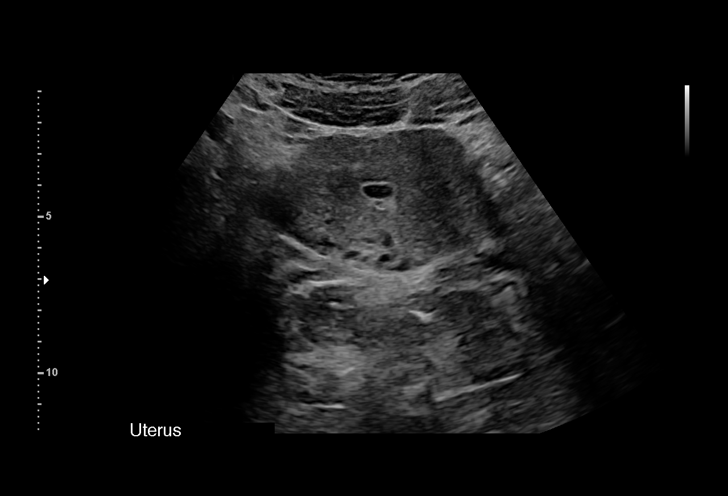
[im 13/55]
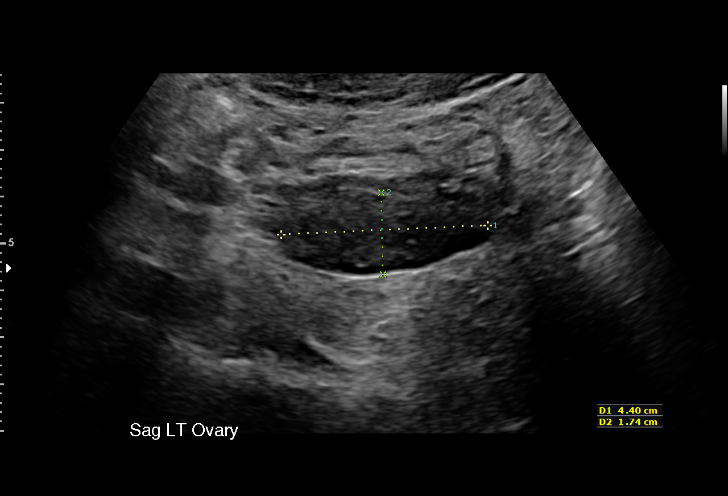
[im 17/55]
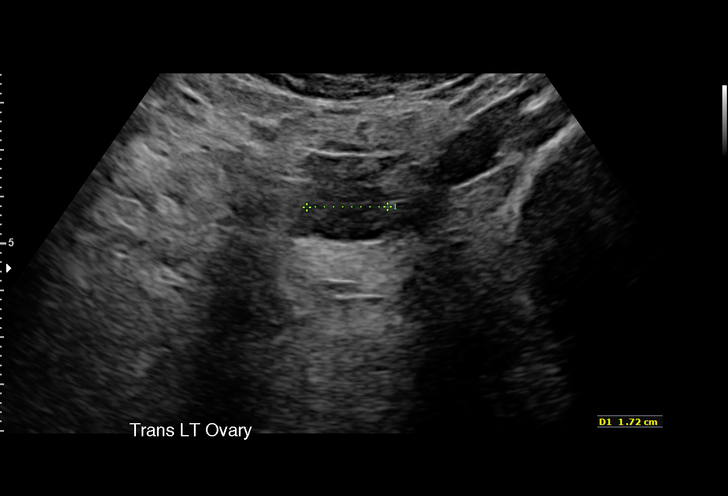
[im 21/55]
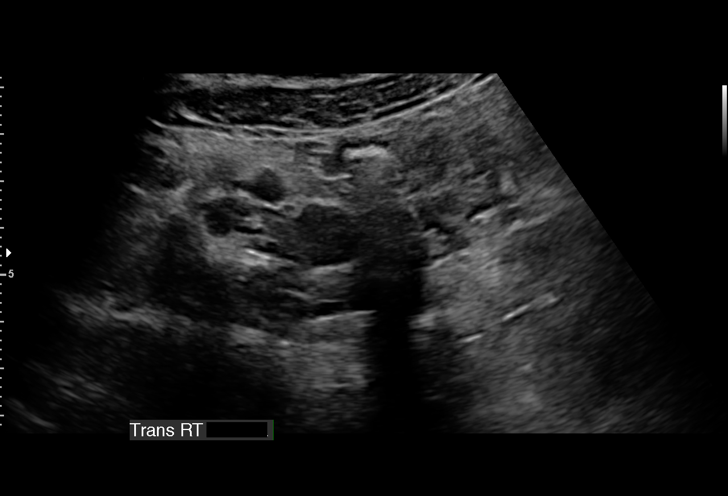
[im 25/55]
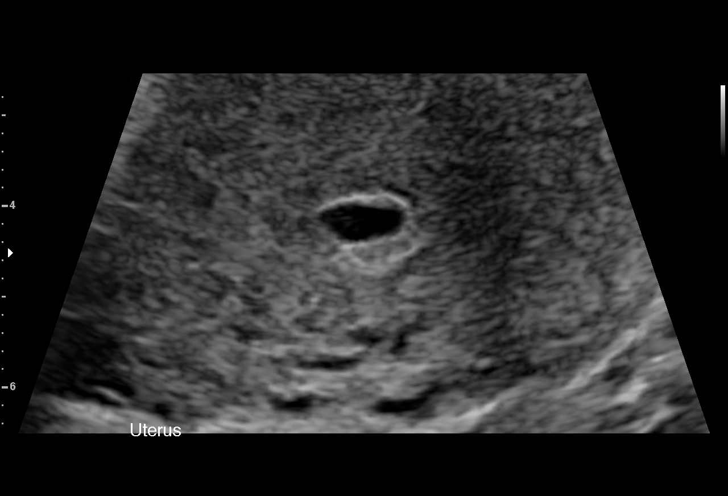
[im 29/55]
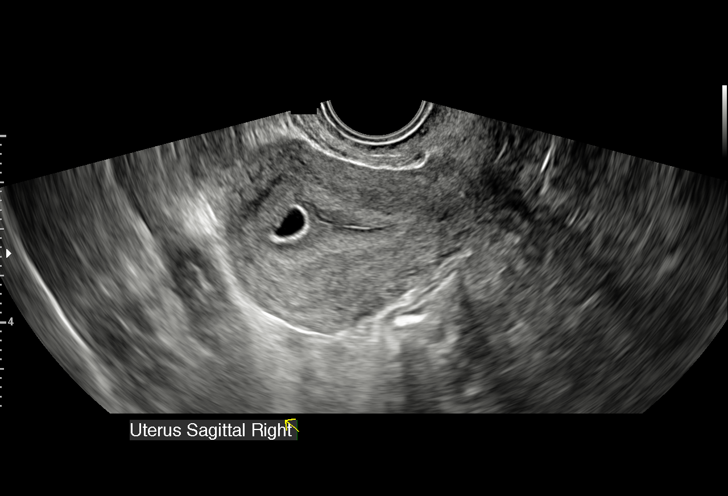
[im 31/55]
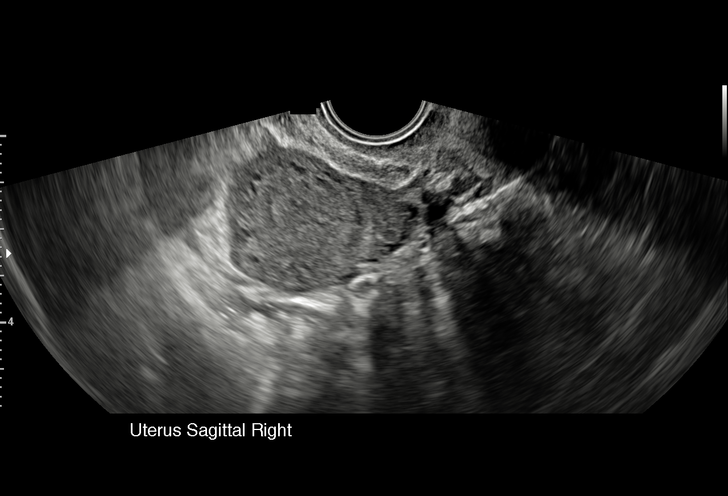
[im 35/55]
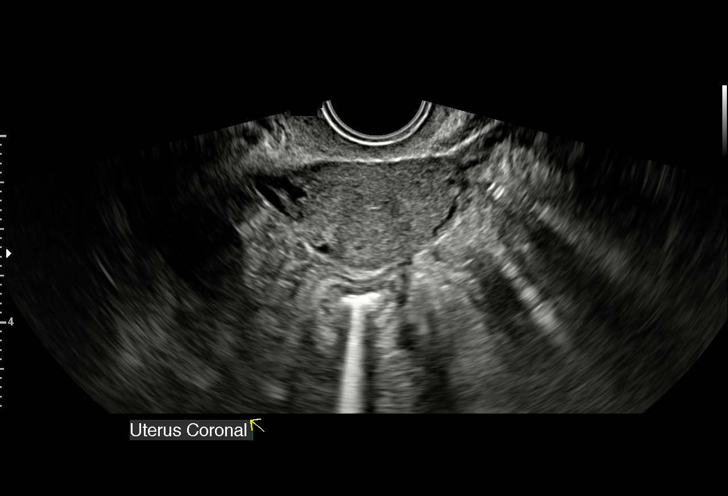
[im 39/55]
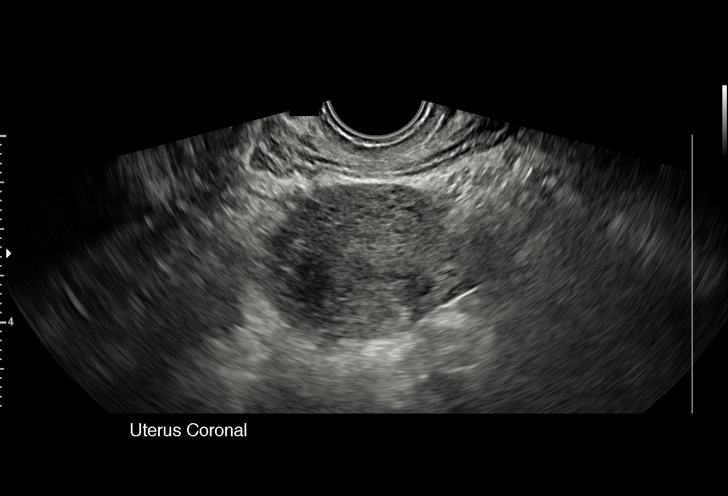
[im 43/55]
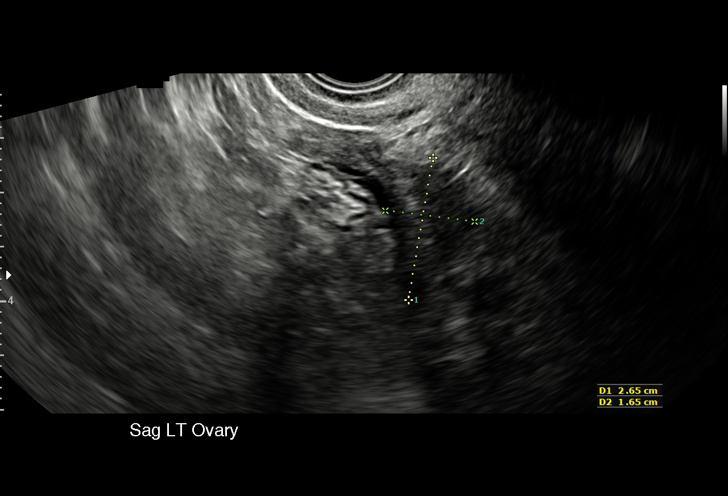
[im 47/55]
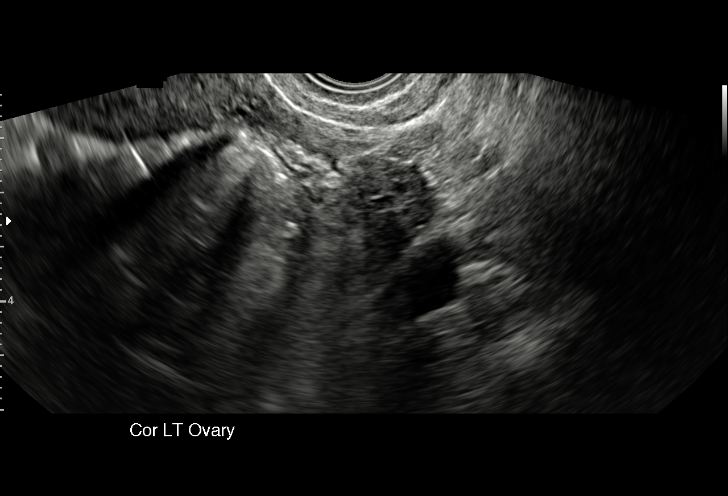
[im 51/55]
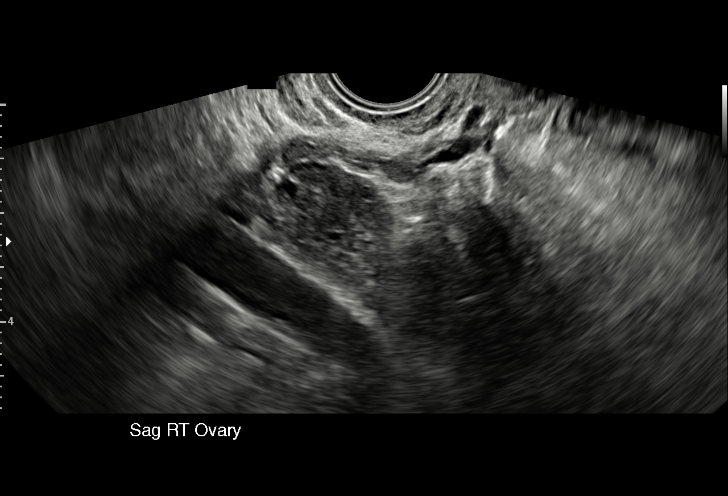
[im 55/55]
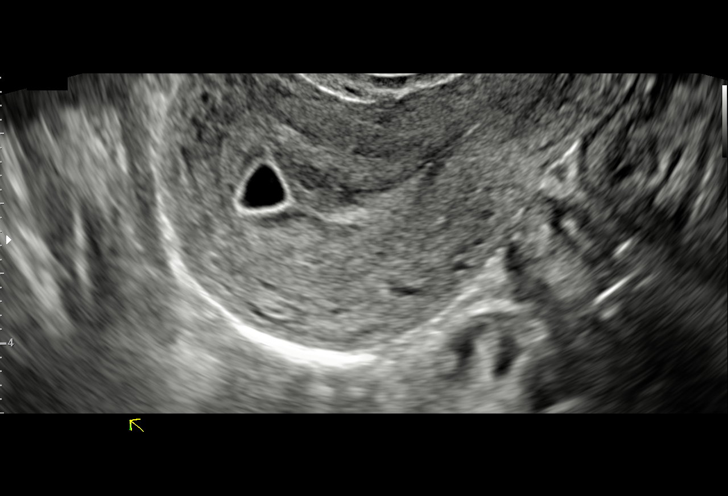

[15 of 28 positions shown; findings below may reference images not displayed]

FINDINGS: Intrauterine gestational sac: Probable intrauterine gestational sac.

Yolk sac:  Not visualized

Embryo:  Not visualized

MSD: 7.7  mm   5 w   3  d

Maternal uterus/adnexae: Ovaries are within normal limits. The right
ovary measures 3.8 x 1.8 by 2 cm. The left ovary measures 2.7 x
x 1.6 cm. No free fluid.
IMPRESSION: Probable early intrauterine gestational sac, but no yolk sac, fetal
pole, or cardiac activity yet visualized. Recommend follow-up
quantitative B-HCG levels and follow-up US in 14 days to assess
viability. This recommendation follows SRU consensus guidelines:
Diagnostic Criteria for Nonviable Pregnancy Early in the First
Trimester. N Engl J Med 3357; [DATE].

## 2018-12-11 ENCOUNTER — Emergency Department (HOSPITAL_COMMUNITY)
Admission: EM | Admit: 2018-12-11 | Discharge: 2018-12-11 | Disposition: A | Discharge status: Home / Self Care | Payer: Medicaid Other | Attending: Emergency Medicine | Admitting: Emergency Medicine

## 2018-12-11 ENCOUNTER — Other Ambulatory Visit: Payer: Self-pay

## 2018-12-11 ENCOUNTER — Encounter (HOSPITAL_COMMUNITY): Payer: Self-pay | Admitting: Emergency Medicine

## 2018-12-11 DIAGNOSIS — B373 Candidiasis of vulva and vagina: Secondary | ICD-10-CM | POA: Insufficient documentation

## 2018-12-11 DIAGNOSIS — F17210 Nicotine dependence, cigarettes, uncomplicated: Secondary | ICD-10-CM | POA: Insufficient documentation

## 2018-12-11 DIAGNOSIS — B3731 Acute candidiasis of vulva and vagina: Secondary | ICD-10-CM

## 2018-12-11 LAB — PREGNANCY, URINE: Preg Test, Ur: NEGATIVE

## 2018-12-11 LAB — URINALYSIS, ROUTINE W REFLEX MICROSCOPIC
Bilirubin Urine: NEGATIVE
Glucose, UA: NEGATIVE mg/dL
Hgb urine dipstick: NEGATIVE
Ketones, ur: NEGATIVE mg/dL
Leukocytes,Ua: NEGATIVE
Nitrite: NEGATIVE
Protein, ur: NEGATIVE mg/dL
Specific Gravity, Urine: 1.023 (ref 1.005–1.030)
pH: 5 (ref 5.0–8.0)

## 2018-12-11 LAB — WET PREP, GENITAL
Clue Cells Wet Prep HPF POC: NONE SEEN
Sperm: NONE SEEN
Trich, Wet Prep: NONE SEEN

## 2018-12-11 LAB — HIV ANTIBODY (ROUTINE TESTING W REFLEX): HIV Screen 4th Generation wRfx: NONREACTIVE

## 2018-12-11 LAB — RPR: RPR Ser Ql: NONREACTIVE

## 2018-12-11 MED ORDER — FLUCONAZOLE 150 MG PO TABS: 150.0000 mg | ORAL_TABLET | Freq: Once | ORAL | 0 refills | Status: AC

## 2018-12-11 MED ORDER — FLUCONAZOLE 150 MG PO TABS
150.0000 mg | ORAL_TABLET | Freq: Once | ORAL | Status: AC
  Administered 2018-12-11: 150 mg via ORAL
  Filled 2018-12-11: qty 1

## 2018-12-11 NOTE — ED Triage Notes (Signed)
Pt. Stated, I started having vaginal itching for 2  Days

## 2018-12-11 NOTE — ED Notes (Signed)
Pharmacy tubing medication.

## 2018-12-11 NOTE — ED Provider Notes (Signed)
[facility] EMERGENCY DEPARTMENT Provider Note   CSN: [id number] Arrival date & time: 12/11/18  0745    History   Chief Complaint No chief complaint on file.   HPI Lisa Cook is a 23 y.o. female.     The history is provided by the patient. No language interpreter was used.  Vaginal Itching      23 year old female presents with concern of potential yeast infection.  Patient report for the past 3 to 4 days she has had vaginal irritation that felt similar to prior yeast infection.  She described as itchiness with some small clumpy non-odorous discharge.  No significant pain.  Symptoms not improving with warm shower.  No report of fever chills, no new sexual partners, no dysuria abdominal pain or rash.  No vaginal bleeding.  Last menstrual period Was 2 weeks ago.  She believes her symptoms may be due to irritation from her tampons.  No recent sick contact or new sexual partner.  No Changes in body hygiene.  Past Medical History:  Diagnosis Date  . Decreased appetite 11/29/2012  . Inguinal hernia 11/2012   right  . Medical history non-contributory     Patient Active Problem List   Diagnosis Date Noted  . Chlamydia infection affecting pregnancy 02/28/2017    Past Surgical History:  Procedure Laterality Date  . INGUINAL HERNIA REPAIR Right 12/05/2012   Procedure: RIGHT INGUINAL HERNIA REPAIR WITH LAP LOOK ON LEFT SIDE FOR POSSIBLE REPAIR;  Surgeon: Judie Petit. Leonia Corona, MD;  Location: [facility];  Service: Pediatrics;  Laterality: Right;  Rigth inguinal hernia repair with laparoscopic look on left (no hernia)     OB History    Gravida  2   Para      Term      Preterm      AB  1   Living        SAB      TAB  1   Ectopic      Multiple      Live Births               Home Medications    Prior to Admission medications   Not on File    Family History Family History  Problem Relation Age of Onset  . Diabetes  Maternal Aunt   . Diabetes Maternal Grandmother   . Hypertension Maternal Grandmother   . Heart disease Maternal Grandmother   . Kidney disease Maternal Grandmother        renal failure  . Asthma Maternal Grandmother   . Asthma Brother   . Sickle cell trait Brother   . Sickle cell trait Sister     Social History Social History   Tobacco Use  . Smoking status: Current Some Day Smoker  . Smokeless tobacco: Never Used  Substance Use Topics  . Alcohol use: No  . Drug use: No     Allergies   Patient has no known allergies.   Review of Systems Review of Systems  All other systems reviewed and are negative.    Physical Exam Updated Vital Signs BP 134/86 (BP Location: Right Arm)   Pulse (!) 58   Temp 98.4 F (36.9 C) (Oral)   Resp 17   LMP 11/30/2018   SpO2 100%   Physical Exam Vitals signs and nursing note reviewed.  Constitutional:      General: She is not in acute distress.    Appearance: She is well-developed.  HENT:  Head: Atraumatic.  Eyes:     Conjunctiva/sclera: Conjunctivae normal.  Neck:     Musculoskeletal: Neck supple.  Genitourinary:    Comments: Chaperone present during exam.  No inguinal lymphadenopathy or inguinal hernia noted.  Normal external genitalia.  No significant discomfort with speculum insertion.  Moderate amount of clumpy white discharge noted in vaginal vault.  Cervical os is closed and normal appearance.  On bimanual examination no adnexal tenderness or CMT. Skin:    Findings: No rash.  Neurological:     Mental Status: She is alert.      ED Treatments / Results  Labs (all labs ordered are listed, but only abnormal results are displayed) Labs Reviewed  WET PREP, GENITAL - Abnormal; Notable for the following components:      Result Value   Yeast Wet Prep HPF POC PRESENT (*)    WBC, Wet Prep HPF POC MODERATE (*)    All other components within normal limits  URINALYSIS, ROUTINE W REFLEX MICROSCOPIC - Abnormal; Notable for  the following components:   Color, Urine AMBER (*)    APPearance HAZY (*)    All other components within normal limits  PREGNANCY, URINE  RPR  HIV ANTIBODY (ROUTINE TESTING W REFLEX)  POC URINE PREG, ED  GC/CHLAMYDIA PROBE AMP ([place]) NOT AT [facility]    EKG None  Radiology No results found.  Procedures Procedures (including critical care time)  Medications Ordered in ED Medications  fluconazole (DIFLUCAN) tablet 150 mg (has no administration in time range)     Initial Impression / Assessment and Plan / ED Course  I have reviewed the triage vital signs and the nursing notes.  Pertinent labs & imaging results that were available during my care of the patient were reviewed by me and considered in my medical decision making (see chart for details).        BP 134/86 (BP Location: Right Arm)   Pulse (!) 58   Temp 98.4 F (36.9 C) (Oral)   Resp 17   LMP 11/30/2018   SpO2 100%    Final Clinical Impressions(s) / ED Diagnoses   Final diagnoses:  Vaginal yeast infection    ED Discharge Orders    None     10:35 AM Pt here with vaginal discomfort.  Finding consistent with vaginal yeast infection.  Treatment given.    Fayrene Helper, PA-C 12/11/18 1036    Benjiman Core, MD 12/11/18 [phone]

## 2018-12-11 NOTE — ED Notes (Signed)
Patient verbalizes understanding of discharge instructions . Opportunity for questions and answers were provided . Armband removed by staff ,Pt discharged from ED. W/C  offered at D/C  and Declined W/C at D/C and was escorted to lobby by RN.  

## 2018-12-11 NOTE — Discharge Instructions (Addendum)
You have been given diflucan as treatment for yeast infection.  If after 1 week your symptoms have not resolved, you may take one additional diflucan.

## 2018-12-12 LAB — GC/CHLAMYDIA PROBE AMP (~~LOC~~) NOT AT ARMC
Chlamydia: NEGATIVE
Neisseria Gonorrhea: NEGATIVE

## 2019-04-02 DIAGNOSIS — U071 COVID-19: Secondary | ICD-10-CM | POA: Diagnosis not present

## 2019-04-23 DIAGNOSIS — Z03818 Encounter for observation for suspected exposure to other biological agents ruled out: Secondary | ICD-10-CM | POA: Diagnosis not present

## 2019-06-10 ENCOUNTER — Encounter: Payer: Self-pay | Admitting: Family Medicine

## 2019-06-10 ENCOUNTER — Ambulatory Visit (INDEPENDENT_AMBULATORY_CARE_PROVIDER_SITE_OTHER): Payer: Self-pay

## 2019-06-10 ENCOUNTER — Other Ambulatory Visit: Payer: Self-pay

## 2019-06-10 DIAGNOSIS — Z3201 Encounter for pregnancy test, result positive: Secondary | ICD-10-CM

## 2019-06-10 LAB — POCT PREGNANCY, URINE: Preg Test, Ur: POSITIVE — AB

## 2019-06-10 NOTE — Progress Notes (Signed)
Pt here for pregnancy test today with positive result. EDD is 02/01/19 based off of LMP 04/27/19. 6w 2d today. Medications and allergies reviewed with patient; pt denies taking any medicines. List of medicines safe to take during pregnancy given. Pt notified front office will provide proof of pregnancy letter and will refer pt to appropriate office for OB care.   Apolonio Schneiders, RN 06/10/19

## 2019-06-11 NOTE — Progress Notes (Signed)
Patient seen and assessed by nursing staff during this encounter. I have reviewed the chart and agree with the documentation and plan.  Aletha Halim, MD 06/11/2019 9:09 AM

## 2019-06-26 ENCOUNTER — Other Ambulatory Visit: Payer: Self-pay

## 2019-06-26 ENCOUNTER — Inpatient Hospital Stay (HOSPITAL_COMMUNITY): Payer: Medicaid Other

## 2019-06-26 ENCOUNTER — Encounter (HOSPITAL_COMMUNITY): Payer: Self-pay | Admitting: *Deleted

## 2019-06-26 ENCOUNTER — Inpatient Hospital Stay (HOSPITAL_COMMUNITY)
Admission: AD | Admit: 2019-06-26 | Discharge: 2019-06-26 | Disposition: A | Discharge status: Home / Self Care | Payer: Medicaid Other | Source: Ambulatory Visit | Attending: Obstetrics & Gynecology | Admitting: Obstetrics & Gynecology

## 2019-06-26 DIAGNOSIS — Z3A08 8 weeks gestation of pregnancy: Secondary | ICD-10-CM | POA: Diagnosis not present

## 2019-06-26 DIAGNOSIS — O26899 Other specified pregnancy related conditions, unspecified trimester: Secondary | ICD-10-CM

## 2019-06-26 DIAGNOSIS — O26891 Other specified pregnancy related conditions, first trimester: Secondary | ICD-10-CM | POA: Diagnosis not present

## 2019-06-26 DIAGNOSIS — F17200 Nicotine dependence, unspecified, uncomplicated: Secondary | ICD-10-CM | POA: Diagnosis not present

## 2019-06-26 DIAGNOSIS — R102 Pelvic and perineal pain: Secondary | ICD-10-CM

## 2019-06-26 DIAGNOSIS — O3680X0 Pregnancy with inconclusive fetal viability, not applicable or unspecified: Secondary | ICD-10-CM | POA: Diagnosis not present

## 2019-06-26 DIAGNOSIS — O219 Vomiting of pregnancy, unspecified: Secondary | ICD-10-CM | POA: Diagnosis not present

## 2019-06-26 DIAGNOSIS — O99331 Smoking (tobacco) complicating pregnancy, first trimester: Secondary | ICD-10-CM | POA: Diagnosis not present

## 2019-06-26 LAB — CBC
HCT: 34.6 % — ABNORMAL LOW (ref 36.0–46.0)
Hemoglobin: 11.6 g/dL — ABNORMAL LOW (ref 12.0–15.0)
MCH: 29.7 pg (ref 26.0–34.0)
MCHC: 33.5 g/dL (ref 30.0–36.0)
MCV: 88.7 fL (ref 80.0–100.0)
Platelets: 289 10*3/uL (ref 150–400)
RBC: 3.9 MIL/uL (ref 3.87–5.11)
RDW: 12.9 % (ref 11.5–15.5)
WBC: 5.7 10*3/uL (ref 4.0–10.5)
nRBC: 0 % (ref 0.0–0.2)

## 2019-06-26 LAB — WET PREP, GENITAL
Sperm: NONE SEEN
Trich, Wet Prep: NONE SEEN
Yeast Wet Prep HPF POC: NONE SEEN

## 2019-06-26 LAB — URINALYSIS, ROUTINE W REFLEX MICROSCOPIC
Bilirubin Urine: NEGATIVE
Glucose, UA: NEGATIVE mg/dL
Hgb urine dipstick: NEGATIVE
Ketones, ur: NEGATIVE mg/dL
Leukocytes,Ua: NEGATIVE
Nitrite: NEGATIVE
Protein, ur: 30 mg/dL — AB
Specific Gravity, Urine: 1.026 (ref 1.005–1.030)
pH: 6 (ref 5.0–8.0)

## 2019-06-26 LAB — HCG, QUANTITATIVE, PREGNANCY: hCG, Beta Chain, Quant, S: 61282 m[IU]/mL — ABNORMAL HIGH (ref <5)

## 2019-06-26 MED ORDER — PROMETHAZINE HCL 25 MG PO TABS: 25.0000 mg | ORAL_TABLET | Freq: Four times a day (QID) | ORAL | 0 refills | Status: DC | PRN

## 2019-06-26 MED ORDER — DOXYLAMINE-PYRIDOXINE 10-10 MG PO TBEC: 2.0000 | DELAYED_RELEASE_TABLET | Freq: Every evening | ORAL | 5 refills | Status: DC | PRN

## 2019-06-26 MED ORDER — PROMETHAZINE HCL 25 MG PO TABS
25.0000 mg | ORAL_TABLET | Freq: Once | ORAL | Status: AC
  Administered 2019-06-26: 25 mg via ORAL
  Filled 2019-06-26: qty 1

## 2019-06-26 NOTE — Discharge Instructions (Signed)
Morning Sickness  Morning sickness is when you feel sick to your stomach (nauseous) during pregnancy. You may feel sick to your stomach and throw up (vomit). You may feel sick in the morning, but you can feel this way at any time of day. Some women feel very sick to their stomach and cannot stop throwing up (hyperemesis gravidarum). Follow these instructions at home: Medicines  Take over-the-counter and prescription medicines only as told by your doctor. Do not take any medicines until you talk with your doctor about them first.  Taking multivitamins before getting pregnant can stop or lessen the harshness of morning sickness. Eating and drinking  Eat dry toast or crackers before getting out of bed.  Eat 5 or 6 small meals a day.  Eat dry and bland foods like rice and baked potatoes.  Do not eat greasy, fatty, or spicy foods.  Have someone cook for you if the smell of food causes you to feel sick or throw up.  If you feel sick to your stomach after taking prenatal vitamins, take them at night or with a snack.  Eat protein when you need a snack. Nuts, yogurt, and cheese are good choices.  Drink fluids throughout the day.  Try ginger ale made with real ginger, ginger tea made from fresh grated ginger, or ginger candies. General instructions  Do not use any products that have nicotine or tobacco in them, such as cigarettes and e-cigarettes. If you need help quitting, ask your doctor.  Use an air purifier to keep the air in your house free of smells.  Get lots of fresh air.  Try to avoid smells that make you feel sick.  Try: ? Wearing a bracelet that is used for seasickness (acupressure wristband). ? Going to a doctor who puts thin needles into certain body points (acupuncture) to improve how you feel. Contact a doctor if:  You need medicine to feel better.  You feel dizzy or light-headed.  You are losing weight. Get help right away if:  You feel very sick to your  stomach and cannot stop throwing up.  You pass out (faint).  You have very bad pain in your belly. Summary  Morning sickness is when you feel sick to your stomach (nauseous) during pregnancy.  You may feel sick in the morning, but you can feel this way at any time of day.  Making some changes to what you eat may help your symptoms go away. This information is not intended to replace advice given to you by your health care provider. Make sure you discuss any questions you have with your health care provider. Document Released: 10/12/2004 Document Revised: 08/17/2017 Document Reviewed: 10/05/2016 Elsevier Patient Education  2020 [employer]  First Trimester of Pregnancy  The first trimester of pregnancy is from week 1 until the end of week 13 (months 1 through 3). During this time, your baby will begin to develop inside you. At 6-8 weeks, the eyes and face are formed, and the heartbeat can be seen on ultrasound. At the end of 12 weeks, all the baby's organs are formed. Prenatal care is all the medical care you receive before the birth of your baby. Make sure you get good prenatal care and follow all of your doctor's instructions. Follow these instructions at home: Medicines  Take over-the-counter and prescription medicines only as told by your doctor. Some medicines are safe and some medicines are not safe during pregnancy.  Take a prenatal vitamin that contains at least  600 micrograms (mcg) of folic acid.  If you have trouble pooping (constipation), take medicine that will make your stool soft (stool softener) if your doctor approves. Eating and drinking   Eat regular, healthy meals.  Your doctor will tell you the amount of weight gain that is right for you.  Avoid raw meat and uncooked cheese.  If you feel sick to your stomach (nauseous) or throw up (vomit): ? Eat 4 or 5 small meals a day instead of 3 large meals. ? Try eating a few soda crackers. ? Drink liquids between  meals instead of during meals.  To prevent constipation: ? Eat foods that are high in fiber, like fresh fruits and vegetables, whole grains, and beans. ? Drink enough fluids to keep your pee (urine) clear or pale yellow. Activity  Exercise only as told by your doctor. Stop exercising if you have cramps or pain in your lower belly (abdomen) or low back.  Do not exercise if it is too hot, too humid, or if you are in a place of great height (high altitude).  Try to avoid standing for long periods of time. Move your legs often if you must stand in one place for a long time.  Avoid heavy lifting.  Wear low-heeled shoes. Sit and stand up straight.  You can have sex unless your doctor tells you not to. Relieving pain and discomfort  Wear a good support bra if your breasts are sore.  Take warm water baths (sitz baths) to soothe pain or discomfort caused by hemorrhoids. Use hemorrhoid cream if your doctor says it is okay.  Rest with your legs raised if you have leg cramps or low back pain.  If you have puffy, bulging veins (varicose veins) in your legs: ? Wear support hose or compression stockings as told by your doctor. ? Raise (elevate) your feet for 15 minutes, 3-4 times a day. ? Limit salt in your food. Prenatal care  Schedule your prenatal visits by the twelfth week of pregnancy.  Write down your questions. Take them to your prenatal visits.  Keep all your prenatal visits as told by your doctor. This is important. Safety  Wear your seat belt at all times when driving.  Make a list of emergency phone numbers. The list should include numbers for family, friends, the hospital, and police and fire departments. General instructions  Ask your doctor for a referral to a local prenatal class. Begin classes no later than at the start of month 6 of your pregnancy.  Ask for help if you need counseling or if you need help with nutrition. Your doctor can give you advice or tell you where  to go for help.  Do not use hot tubs, steam rooms, or saunas.  Do not douche or use tampons or scented sanitary pads.  Do not cross your legs for long periods of time.  Avoid all herbs and alcohol. Avoid drugs that are not approved by your doctor.  Do not use any tobacco products, including cigarettes, chewing tobacco, and electronic cigarettes. If you need help quitting, ask your doctor. You may get counseling or other support to help you quit.  Avoid cat litter boxes and soil used by cats. These carry germs that can cause birth defects in the baby and can cause a loss of your baby (miscarriage) or stillbirth.  Visit your dentist. At home, brush your teeth with a soft toothbrush. Be gentle when you floss. Contact a doctor if:  You are dizzy.  You have mild cramps or pressure in your lower belly.  You have a nagging pain in your belly area.  You continue to feel sick to your stomach, you throw up, or you have watery poop (diarrhea).  You have a bad smelling fluid coming from your vagina.  You have pain when you pee (urinate).  You have increased puffiness (swelling) in your face, hands, legs, or ankles. Get help right away if:  You have a fever.  You are leaking fluid from your vagina.  You have spotting or bleeding from your vagina.  You have very bad belly cramping or pain.  You gain or lose weight rapidly.  You throw up blood. It may look like coffee grounds.  You are around people who have [place] measles, fifth disease, or chickenpox.  You have a very bad headache.  You have shortness of breath.  You have any kind of trauma, such as from a fall or a car accident. Summary  The first trimester of pregnancy is from week 1 until the end of week 13 (months 1 through 3).  To take care of yourself and your unborn baby, you will need to eat healthy meals, take medicines only if your doctor tells you to do so, and do activities that are safe for you and your  baby.  Keep all follow-up visits as told by your doctor. This is important as your doctor will have to ensure that your baby is healthy and growing well. This information is not intended to replace advice given to you by your health care provider. Make sure you discuss any questions you have with your health care provider. Document Released: 02/21/2008 Document Revised: 12/26/2018 Document Reviewed: 09/12/2016 Elsevier Patient Education  2020 Elsevier Inc.   Nausea medication to take during pregnancy:   Unisom (doxylamine succinate 25 mg tablets) Take one tablet daily at bedtime. If symptoms are not adequately controlled, the dose can be increased to a maximum recommended dose of two tablets daily (1/2 tablet in the morning, 1/2 tablet mid-afternoon and one at bedtime).  Vitamin B6 100mg  tablets. Take one tablet twice a day (up to 200 mg per day).

## 2019-06-26 NOTE — MAU Note (Signed)
Pt. Reports abdominal pain 6/10 since 06/22/19. Nausea preventing her from eating. Vomited twice in the last 24hrs.  She has taken tylenol but didn't have any relief. No vaginal bleeding or discharge.

## 2019-06-26 NOTE — MAU Provider Note (Signed)
Chief Complaint: Abdominal Pain   First Provider Initiated Contact with Patient 06/26/19 0419        SUBJECTIVE HPI: Lisa Cook is a 23 y.o. G3P0010 at [redacted]w[redacted]d by LMP who presents to maternity admissions reporting pelvic pain since 10/4.  Also has nausea all day with vomiting 2 x per day.  Able to keep down fluids well. . She denies vaginal bleeding, vaginal itching/burning, urinary symptoms, h/a, dizziness, n/v, or fever/chills.    RN note: Pt. Reports abdominal pain 6/10 since 06/22/19. Nausea preventing her from eating. Vomited twice in the last 24hrs.  She has taken tylenol but didn't have any relief. No vaginal bleeding or discharge.    Past Medical History:  Diagnosis Date  . Decreased appetite 11/29/2012  . Inguinal hernia 11/2012   right  . Medical history non-contributory    Past Surgical History:  Procedure Laterality Date  . INGUINAL HERNIA REPAIR Right 12/05/2012   Procedure: RIGHT INGUINAL HERNIA REPAIR WITH LAP LOOK ON LEFT SIDE FOR POSSIBLE REPAIR;  Surgeon: Judie Petit. Leonia Corona, MD;  Location: [facility];  Service: Pediatrics;  Laterality: Right;  Rigth inguinal hernia repair with laparoscopic look on left (no hernia)   Social History   Socioeconomic History  . Marital status: Single    Spouse name: Not on file  . Number of children: Not on file  . Years of education: Not on file  . Highest education level: Not on file  Occupational History  . Not on file  Social Needs  . Financial resource strain: Not on file  . Food insecurity    Worry: Not on file    Inability: Not on file  . Transportation needs    Medical: Not on file    Non-medical: Not on file  Tobacco Use  . Smoking status: Current Some Day Smoker  . Smokeless tobacco: Never Used  Substance and Sexual Activity  . Alcohol use: No  . Drug use: No  . Sexual activity: Yes    Birth control/protection: None  Lifestyle  . Physical activity    Days per week: Not on file    Minutes  per session: Not on file  . Stress: Not on file  Relationships  . Social Musician on phone: Not on file    Gets together: Not on file    Attends religious service: Not on file    Active member of club or organization: Not on file    Attends meetings of clubs or organizations: Not on file    Relationship status: Not on file  . Intimate partner violence    Fear of current or ex partner: Not on file    Emotionally abused: Not on file    Physically abused: Not on file    Forced sexual activity: Not on file  Other Topics Concern  . Not on file  Social History Narrative  . Not on file   No current facility-administered medications on file prior to encounter.    No current outpatient medications on file prior to encounter.   No Known Allergies  I have reviewed patient's Past Medical Hx, Surgical Hx, Family Hx, Social Hx, medications and allergies.   ROS:  Review of Systems  Constitutional: Negative for chills and fever.  Gastrointestinal: Positive for abdominal pain, nausea and vomiting. Negative for constipation and diarrhea.  Genitourinary: Positive for pelvic pain. Negative for dysuria, vaginal bleeding and vaginal discharge.  Musculoskeletal: Negative for back pain.   Other  systems negative   Physical Exam  Physical Exam Patient Vitals for the past 24 hrs:  BP Temp Temp src Pulse Resp SpO2  06/26/19 0404 124/65 98.4 F (36.9 C) Oral 60 16 100 %   Constitutional: Well-developed, well-nourished female in no acute distress.  Cardiovascular: normal rate Respiratory: normal effort GI: Abd soft, non-tender. Pos BS x 4 MS: Extremities nontender, no edema, normal ROM Neurologic: Alert and oriented x 4.  GU: Neg CVAT.  PELVIC EXAM: Cervix pink, visually closed, without lesion, scant white creamy discharge, vaginal walls and external genitalia normal Bimanual exam: Cervix 0/long/high, firm, anterior, neg CMT, uterus nontender, enlarged to 8 wk size, adnexa without  tenderness, enlargement, or mass   LAB RESULTS Results for orders placed or performed during the hospital encounter of 06/26/19 (from the past 24 hour(s))  Urinalysis, Routine w reflex microscopic     Status: Abnormal   Collection Time: 06/26/19  3:59 AM  Result Value Ref Range   Color, Urine YELLOW YELLOW   APPearance CLEAR CLEAR   Specific Gravity, Urine 1.026 1.005 - 1.030   pH 6.0 5.0 - 8.0   Glucose, UA NEGATIVE NEGATIVE mg/dL   Hgb urine dipstick NEGATIVE NEGATIVE   Bilirubin Urine NEGATIVE NEGATIVE   Ketones, ur NEGATIVE NEGATIVE mg/dL   Protein, ur 30 (A) NEGATIVE mg/dL   Nitrite NEGATIVE NEGATIVE   Leukocytes,Ua NEGATIVE NEGATIVE   RBC / HPF 0-5 0 - 5 RBC/hpf   WBC, UA 0-5 0 - 5 WBC/hpf   Bacteria, UA RARE (A) NONE SEEN   Squamous Epithelial / LPF 6-10 0 - 5   Mucus PRESENT   Wet prep, genital     Status: Abnormal   Collection Time: 06/26/19  4:34 AM   Specimen: Vaginal  Result Value Ref Range   Yeast Wet Prep HPF POC NONE SEEN NONE SEEN   Trich, Wet Prep NONE SEEN NONE SEEN   Clue Cells Wet Prep HPF POC PRESENT (A) NONE SEEN   WBC, Wet Prep HPF POC MANY (A) NONE SEEN   Sperm NONE SEEN   CBC     Status: Abnormal   Collection Time: 06/26/19  5:10 AM  Result Value Ref Range   WBC 5.7 4.0 - 10.5 K/uL   RBC 3.90 3.87 - 5.11 MIL/uL   Hemoglobin 11.6 (L) 12.0 - 15.0 g/dL   HCT 34.6 (L) 36.0 - 46.0 %   MCV 88.7 80.0 - 100.0 fL   MCH 29.7 26.0 - 34.0 pg   MCHC 33.5 30.0 - 36.0 g/dL   RDW 12.9 11.5 - 15.5 %   Platelets 289 150 - 400 K/uL   nRBC 0.0 0.0 - 0.2 %      IMAGING US Ob Comp Less 14 Wks  Result Date: 06/26/2019 CLINICAL DATA:  Pelvic pain affecting pregnancy. Pregnancy of unknown location EXAM: OBSTETRIC <14 WK ULTRASOUND TECHNIQUE: Transabdominal ultrasound was performed for evaluation of the gestation as well as the maternal uterus and adnexal regions. COMPARISON:  None. FINDINGS: Intrauterine gestational sac: Single Yolk sac:  Visualized. Embryo:   Visualized. Cardiac Activity: Visualized. Heart Rate: 166 bpm CRL:   17.1 mm   8 w 1 d                  [place] EDC: 02/04/2020 Subchorionic hemorrhage:  None visualized. Maternal uterus/adnexae: No abnormal finding IMPRESSION: Single living intrauterine pregnancy measuring 8 weeks 1 day. No unexpected finding. Electronically Signed   By: Monte Fantasia M.D.   On: 06/26/2019  05:14    MAU Management/MDM: Ordered usual first trimester r/o ectopic labs.   Pelvic exam and cultures done Will check baseline Ultrasound to rule out ectopic.   Treatments in MAU included Phenergan which did help nausea.   This bleeding/pain can represent a normal pregnancy with bleeding, spontaneous abortion or even an ectopic which can be life-threatening.  The process as listed above helps to determine which of these is present.  Reviewed findings and new EDC.  Reviewed our clinic system and how to call   ASSESSMENT Pelvic pain in early pregnancy Pregnancy of unknown location SIngle live intrauterine pregnancy at [id number]  PLAN Discharge home Encouraged to start prenatal care Rx Diclegis with instructions for nausea Rx Phenergan as back up med for nausea  Pt stable at time of discharge. Encouraged to return here or to other Urgent Care/ED if she develops worsening of symptoms, increase in pain, fever, or other concerning symptoms.    Wynelle BourgeoisMarie Williams CNM, MSN Certified Nurse-Midwife 06/26/2019  4:19 AM

## 2019-07-03 LAB — GC/CHLAMYDIA PROBE AMP (~~LOC~~) NOT AT ARMC
Chlamydia: NEGATIVE
Comment: NEGATIVE
Comment: NORMAL
Neisseria Gonorrhea: NEGATIVE

## 2019-07-08 ENCOUNTER — Other Ambulatory Visit: Payer: Self-pay

## 2019-07-08 ENCOUNTER — Ambulatory Visit (INDEPENDENT_AMBULATORY_CARE_PROVIDER_SITE_OTHER): Payer: Medicaid Other | Admitting: *Deleted

## 2019-07-08 DIAGNOSIS — Z3490 Encounter for supervision of normal pregnancy, unspecified, unspecified trimester: Secondary | ICD-10-CM

## 2019-07-08 MED ORDER — PREPLUS 27-1 MG PO TABS: 1.0000 | ORAL_TABLET | Freq: Every day | ORAL | 9 refills | Status: DC

## 2019-07-08 MED ORDER — BLOOD PRESSURE KIT DEVI: 1.0000 | 0 refills | Status: DC | PRN

## 2019-07-08 NOTE — Progress Notes (Signed)
Patient seen and assessed by nursing staff during this encounter. I have reviewed the chart and agree with the documentation and plan.  Kerry Hough, PA-C 07/08/2019 10:13 AM

## 2019-07-08 NOTE — Patient Instructions (Signed)

## 2019-07-08 NOTE — Progress Notes (Signed)
I connected with  Lisa Cook on 07/08/19 at  8:30 AM EDT by telephone and verified that I am speaking with the correct person using two identifiers.   I discussed the limitations, risks, security and privacy concerns of performing an evaluation and management service by telephone and the availability of in person appointments. I also discussed with the patient that there may be a patient responsible charge related to this service. The patient expressed understanding and agreed to proceed.  Explained I am completing her New OB Intake today. We discussed Her EDD and that it is based on  sure LMP . I reviewed her allergies, meds, OB History, Medical /Surgical history, and appropriate screenings. I explained we will send her Bab[employer] app- app sent to her while on phone.  Explained we will order  a blood pressure cuff for her and the pharmacy that will fill the prescription will call her to verify her information. I asked her to bring the blood pressure cuff with her to her first ob appointment so we can show her how to use it. Explained  then we will have her take her blood pressure weekly and enter into the app. Explained she will have some visits in office and some virtually. I sent her MyChart sign up text and she downloaded the  MyChart app. I reviewed her new ob appointment date/ time with her , our location and to wear mask, no visitors. Explained she will have exam, ob bloodwork, genetic testing if desired, pap if needed. I scheduled an [place] at 19 weeks and gave her the appointment.I informed her of [facility] services and put information in her instructions.  She voices understanding.  Linda,RN 07/08/2019  8:34 AM

## 2019-07-10 DIAGNOSIS — Z3400 Encounter for supervision of normal first pregnancy, unspecified trimester: Secondary | ICD-10-CM | POA: Diagnosis not present

## 2019-07-15 ENCOUNTER — Other Ambulatory Visit: Payer: Self-pay

## 2019-07-15 ENCOUNTER — Other Ambulatory Visit (HOSPITAL_COMMUNITY)
Admission: RE | Admit: 2019-07-15 | Discharge: 2019-07-15 | Disposition: A | Discharge status: Home / Self Care | Payer: Medicaid Other | Source: Ambulatory Visit | Attending: Medical | Admitting: Medical

## 2019-07-15 ENCOUNTER — Ambulatory Visit (INDEPENDENT_AMBULATORY_CARE_PROVIDER_SITE_OTHER): Payer: Medicaid Other | Admitting: Medical

## 2019-07-15 ENCOUNTER — Encounter: Payer: Self-pay | Admitting: Medical

## 2019-07-15 VITALS — BP 125/75 | HR 71 | Wt 135.0 lb

## 2019-07-15 DIAGNOSIS — N760 Acute vaginitis: Secondary | ICD-10-CM | POA: Diagnosis not present

## 2019-07-15 DIAGNOSIS — N898 Other specified noninflammatory disorders of vagina: Secondary | ICD-10-CM

## 2019-07-15 DIAGNOSIS — Z23 Encounter for immunization: Secondary | ICD-10-CM | POA: Diagnosis not present

## 2019-07-15 DIAGNOSIS — Z3490 Encounter for supervision of normal pregnancy, unspecified, unspecified trimester: Secondary | ICD-10-CM | POA: Diagnosis present

## 2019-07-15 DIAGNOSIS — Z3491 Encounter for supervision of normal pregnancy, unspecified, first trimester: Secondary | ICD-10-CM

## 2019-07-15 DIAGNOSIS — B9689 Other specified bacterial agents as the cause of diseases classified elsewhere: Secondary | ICD-10-CM

## 2019-07-15 DIAGNOSIS — Z3A11 11 weeks gestation of pregnancy: Secondary | ICD-10-CM

## 2019-07-15 DIAGNOSIS — Z3481 Encounter for supervision of other normal pregnancy, first trimester: Secondary | ICD-10-CM | POA: Diagnosis not present

## 2019-07-15 MED ORDER — METRONIDAZOLE 0.75 % VA GEL: 1.0000 | Freq: Every day | VAGINAL | 0 refills | Status: DC

## 2019-07-15 NOTE — Progress Notes (Signed)
   PRENATAL VISIT NOTE  Subjective:  Lisa Cook is a 23 y.o. G3P0020 at [redacted]w[redacted]d being seen today for her first prenatal visit for this pregnancy.  She is currently monitored for the following issues for this low-risk pregnancy and has Supervision of low-risk pregnancy on their problem list.  Patient reports vaginal discharge and odor .  Contractions: Not present. Vag. Bleeding: None.  Movement: Absent. Denies leaking of fluid.   She is planning to breastfeed. Unsure of contraception plan at this time.   The following portions of the patient's history were reviewed and updated as appropriate: allergies, current medications, past family history, past medical history, past social history, past surgical history and problem list.   Objective:   Vitals:   07/15/19 1021  BP: 125/75  Pulse: 71  Weight: 135 lb (61.2 kg)    Fetal Status: Fetal Heart Rate (bpm): 162   Movement: Absent     General:  Alert, oriented and cooperative. Patient is in no acute distress.  Skin: Skin is warm and dry. No rash noted.   Cardiovascular: Normal heart rate and rhythm noted  Respiratory: Normal respiratory effort, no problems with respiration noted. Clear to auscultation.   Abdomen: Soft, gravid, appropriate for gestational age. Normal bowel sounds. Non-tender. Pain/Pressure: Absent     Pelvic: Cervical exam performed Dilation: Closed Effacement (%): Thick   Normal cervical contour, no lesions, no bleeding following pap, moderate white discharge  Extremities: Normal range of motion.  Edema: None  Mental Status: Normal mood and affect. Normal behavior. Normal judgment and thought content.   Assessment and Plan:  Pregnancy: [id number] at [redacted]w[redacted]d 1. Encounter for supervision of low-risk pregnancy, antepartum - Cytology - PAP( Exira) - Hemoglobin A1c - Genetic Screening - panorama and horizon  - Culture, OB Urine - Obstetric Panel, Including HIV - Flu Vaccine QUAD 36+ mos IM (Fluarix, Quad PF) -  GC/Chlamydia negative on 10/8 - Anatomy US already scheduled at NOB intake for 19 weeks  - CMA to check on status of BP cuff ordered on 07/08/19  2. Vaginal odor  3. BV (bacterial vaginosis) - Wet prep from MAU showed clue cells, now that patient is symptomatic will treat  - metroNIDAZOLE (METROGEL VAGINAL) 0.75 % vaginal gel; Place 1 Applicatorful vaginally at bedtime.  Dispense: 70 g; Refill: 0  The nature of our practice with multiple providers, students and residents was discussed with the patient  The cadence of visits with Baby Rx was also discussed. The patient was encouraged to enter a BP and weight weekly in the Baby Rx App.  The patient has MyChart for results and communication   Preterm labor/ first trimester warning symptoms and general obstetric precautions including but not limited to vaginal bleeding, contractions, leaking of fluid and fetal movement were reviewed in detail with the patient. Please refer to After Visit Summary for other counseling recommendations.   Return in about 8 weeks (around 09/09/2019) for LOB, In-Person.  Future Appointments  Date Time Provider [place]  09/08/2019 10:30 AM [facility] [place]    Kerry Hough, PA-C

## 2019-07-15 NOTE — Patient Instructions (Addendum)
AREA PEDIATRIC/FAMILY PRACTICE PHYSICIANS  Central/Southeast [place] [phone]) . [facility] Davy Pique, MD; Gwendlyn Deutscher, MD; Walker Kehr, MD; Andria Frames, MD; McDiarmid, MD; Dutch Quint, MD; Nori Riis, MD; Mingo Amber, [place]., [place], [place] [address] o [phone] o Mon-Fri 8:30-12:30, 1:30-5:00 o Providers come to see babies at [facility] o Accepting Medicaid . [place] [facility] [place] providers who accept newborns: Dorthy Cooler, MD; Orland Mustard, MD; Stephanie Acre, MD o [place], Rena Lara, [place] [address] o [phone] o Mon-Fri 8:00-5:30 o Babies seen by providers at [facility] o Does NOT accept Medicaid o Please call early in hospitalization for appointment (limited availability)  . Mustard [place]. [place], MD o [address], [place], [place] [address] o [phone] o Mon, Tue, Thur, Fri 8:30-5:00, Wed 10:00-7:00 (closed 1-2pm) o Babies seen by [facility] providers o Accepting Medicaid . [place], MD o [place], [place], [place] [address] o [phone] o Mon-Fri 8:30-5:00, Sat 8:30-12:00 o Provider comes to see babies at [place] Medicaid o Must have been referred from current patients or contacted office prior to delivery . [place] [facility] ([place] for [facility]) Franne Forts, MD; Tamera Punt, MD; Doneen Poisson, MD; Fatima Sanger, MD; Wynetta Emery, MD; Jess Barters, MD; Tami Ribas, MD; Herbert Moors, MD; Derrell Lolling, MD; Dorothyann Peng, MD; Lucious Groves, NP; Baldo Ash, NP o [address], [place], [place] [address] o [phone] o Mon, Tue, Thur, Fri 8:30-5:30, Wed 9:30-5:30, Sat 8:30-12:30 o Babies seen by [facility] providers o Accepting Medicaid o Only accepting infants of first-time parents or siblings of current patients [facility] discharge coordinator will make follow-up appointment . Baltazar Najjar o [place] [address],  [place], [place]  [address] o [phone]   Fax - [phone] . [facility] o [address], [place], [place]  [address] o Phone - [phone]   Fax [phone] . [place], [place], [place], [place]  [address] o [phone]  East/Northeast [place] [phone]) . [facility] Reginal Lutes, MD; Jacklynn Ganong, MD; Torrie Mayers, MD; MD; Rosana Hoes, MD; Servando Salina, MD; Rose Fillers, MD; Rex Kras, MD; Corinna Capra, MD; Volney American, MD; Trilby Drummer, MD; Janann Colonel, MD; Jimmye [place], [place], [place], [place] [address] o [phone] o Mon-Fri 8:30-5:00 (extended evenings Mon-Thur as needed), Sat-Sun 10:00-1:00 o Providers come to see babies at [place] Medicaid for families of first-time babies and families with all children in the household age 72 and under. Must register with office prior to making appointment (M-F only). . [place], NP; Tomi Bamberger, MD; Redmond School, MD; [place], [place] [address] [place], [place] [address] o [phone] o Mon-Fri 8:00-5:00 o Babies seen by providers at [facility] o Does NOT accept Medicaid/Commercial Insurance Only . [facility])  Marnee Guarneri, MD; Drema Dallas, MD; Montine Circle, MD; Vilma Prader, MD; Vanita Panda, MD; Alfonso Ramus, MD; Ruthann Cancer, MD; Roxanne Mins, MD; Rosalva Ferron, MD; Polly Cobia, MD o [address] [place], [place] [address] o [phone] o Mon-Fri 8:30-5:30, Sat (Oct.-Mar.) 9:00-1:00 o Babies seen by providers at [place] [phone]) . [facility] Peers, MD; Suzan Slick, MD o [address], [place],  [address] o [phone] o Mon-Fri 8:30-5:00, Sat 8:30-12:00 o Providers come to see babies at [facility] o Does NOT accept Medicaid . [facility] Ricci Barker, PA; Mannie Stabile, MD; [place], [place]; Nancy Fetter, MD; Moreen Fowler, [place],  [place], [place] [address] o [phone] o Mon-Fri 8:00-5:00 o Babies seen by providers at [facility] o Does NOT accept Medicaid o Only accepting babies of parents who are patients o Please call early in hospitalization for appointment (limited availability) . [facility] Pediatricians Blanca Friend, MD; Sharlene Motts, MD; Rod Can, MD; Warner Mccreedy, NP; Sabra Heck, MD; Ermalinda Memos, MD; Sharlett Iles, NP; Aurther Loft, MD; Jerrye Beavers, MD; Marcello Moores, MD; Berline Lopes, MD; Charolette Forward, MD o [address] [place], [place], [place] [address] o [phone] o Mon-Fri 8:00-5:00, Sat 9:00-12:00 o Providers come to see babies at [facility] o Does NOT accept [facility] [phone]) . [place] [facility] [place] providers accepting new patients: Dayna Ramus, NP; [place], [place], [place], [place] [address] o [phone] o Mon-Fri 8:00-5:00 o Babies seen by providers at [facility] o Does NOT accept Medicaid o Only accepting babies of parents who are patients o Please call early in hospitalization for appointment (limited availability) . [facility] Oswaldo Conroy, MD; Sheran Lawless, MD o [address] [place], [place] [address] o [phone] (press 1 to schedule appointment) o Mon-Fri 8:00-5:00 o Providers come to see babies at [facility] o Does NOT accept Medicaid . [facility] Jodi Mourning, MD o [address], [place], [place] [address] o [phone] o Mon-Fri 8:30-5:00 (lunch 12:30-1:00), extended hours by appointment only Wed 5:00-6:30 o Babies seen by [facility] providers o Accepting Medicaid . [place] at Evalyn Casco, MD; [place], MD; Ethlyn Gallery, MD o [place], [place], [place] [address] o [phone] o Mon-Fri 8:00-5:00 o Babies seen by [facility] providers o Does NOT accept Medicaid . Therapist, music at [place], MD; Yong Channel, MD; [place], [place] [address] [address] [place], [place]  [address] o [phone] o Mon-Fri 8:00-5:00 o Babies seen by [facility] providers o Does NOT accept Medicaid . [place], [place]; [place], [place]; [place], NP; Albertina Parr, MD; Frederic Jericho, MD; Ronney Lion, MD; Carlos Levering, NP; Jerelene Redden, NP; Tomasita Crumble, NP; Ronelle Nigh, NP; Corinna Lines, MD; [place], MD o [address]., [place], [place] [address] o [phone] o Mon-Fri 8:30-5:00, Sat 10:00-1:00 o Providers come to see babies at [facility] o Does NOT accept Medicaid o Free prenatal information session Tuesdays at 4:45pm . [facility] Porfirio Oar, MD; [place], [place]; [place], [place]; [place], [place]., [place] [address] o [phone] o Mon-Fri 7:30-5:30 o Babies seen by [facility] providers . [facility] o [address] [place], [place], [place]  [address] o [phone]   Fax - [phone]  [place] [phone] & [phone]) . [place], MD o [address] [place], [place] [address] o [phone] o Mon-Thur 8:00-6:00 o Providers come to see babies at [place] Medicaid . [place], NP; Melford Aase, MD; [place], [place]; [place], [place]., [place], [place] [address] o [phone] o Mon-Thur 7:30-7:30, Fri 7:30-4:30 o Babies seen by [facility] providers o Accepting Medicaid . [facility] Nyra Jabs, MD; Cristino Martes, NP; Gertie Baron, MD o [address], [place],  [address] o [phone] o Mon-Fri 8:30-5:00, Sat 8:30-12:00 o Providers come to see babies at [place] Medicaid o Must have "Meet & Greet" appointment at office prior to delivery . [place] ([place]) o [place],  MD; Juleen China, MD; Clydene Laming, [place] [address], [place], [place] [address] o [phone] o Mon-Wed 8:00-6:00, Thur-Fri 8:00-5:00, Sat 9:00-12:00 o Providers come to  see babies at [facility] o Does NOT accept Medicaid o Only accepting siblings of current patients . [facility] [place], [place], [place], [place]  [address] o [phone]   Fax [phone] . [place] at [place] [address], [place], [place]  [address] o [phone]   Fax - Morton Gorman [phone] & [phone]) . Therapist, music at [place], DO; [place], [place]., [place], [place] [address] o [phone] o Mon-Fri 7:00-5:00 o Babies seen by [facility] providers o Does NOT accept Medicaid . [place], MD; [place], [place]; [place], [place], [place], [place] [address] o [phone] o Mon-Fri 8:00-5:00 o Babies seen by [facility] providers o Accepting Medicaid . [place], MD; [place], [place]; [place], NP; [place], [place], [place], [place] [address] o [phone] o Mon-Fri 8:00-5:00 o Babies seen by providers at [place] [phone]) . [facility] [place], [place] o [address] [place], [place] [address] o [phone] o Mon-Fri 8:00-5:00 o Babies seen by [facility] providers o Does NOT accept Medicaid o Limited availability, please call early in hospitalization to schedule follow-up . [facility] Leilani Merl, PA; Maisie Fus, MD; [place], MD; [place], [place]; Jeannine Kitten, MD; [place], [place] [facility] [address], [place], [place] [address] o [phone] o Mon-Fri 8:30-5:00, Sat 9:00-12:00 o Babies seen by providers at [facility] o Accepting Medicaid o Please register online then schedule online or call office o [email or web address] . [place] ([place] at  [place]) Kristian Covey, NP; Dwyane Dee, MD; Leonidas Romberg, PA o [address] [place], [place], [place] [address] o [phone] o Mon-Fri 8:00-5:00 o Babies seen by providers at [place] o Accepting Medicaid . [place] ([facility] [employer]) Dairl Ponder, MD; Rayvon Char, NP; Melina Modena, MD o [address] [place], [place], [place] [address] o [phone] o Mon-Fri 8:00-5:30, Sat&Sun by appointment (phones open at 8:30) o Babies seen by [facility] providers o Accepting Medicaid o Must be a first-time baby or sibling of current patient . [place], [address], [place], [place]  [address] o [phone]   Fax - [phone]  [place] [phone] & [phone]) . [place], [place]; [place], [place]; Benjamine Mola, MD; [place], [place]; Harrell Lark, MD o [address] [place], [place] [address] o [phone] o Mon-Thur 8:00-7:00, Fri 8:00-5:00, Sat 8:00-12:00, Sun 9:00-12:00 o Babies seen by [facility] providers o Accepting Medicaid . [facility] at [facility], MD; Ruthann Cancer, MD; [facility], MD o [address], [place], [place] [address] o [phone] o Mon-Thur 8:00-5:00 o Babies seen by providers at [facility] o Accepting Medicaid . [facility] at [place], MD; Coe-Go[place], MD; Amedeo Plenty, MD; Bobby Rumpf, MD; List, MD; Lavonia Drafts, MD; Ruthann Cancer, MD; Selinda Eon, MD; Audie Box, MD; Jim Like, MD; Christie Nottingham, MD; Hubbard Hartshorn, MD; Modena Nunnery, MD o [address] [place], [place]  [address] o [phone] o Mon-Fri 8:00-5:30, Sat (Oct.-Mar.) 9:00-1:00 o Babies seen by providers at [facility] o Accepting Medicaid o Must fill out new patient packet, available online at [email or web address] . [place] ([facility]) Barnabas Lister, NP; Kenton Kingfisher, NP; Claiborne Billings, NP; Rolla Plate, MD;  [place], [place]; [place] Rhine, MD; Tyron Russell, MD; Delia Chimes, NP o [address], [place], [place] [address] o [phone] o Mon-Thur 8:00-5:30, Fri 8:00-5:00 o Babies seen by providers at [place] [phone]) . [place], [place]; [place], MD; Dennard Schaumann, MD; [place], [place] o [address] [place], [place] [address] o [phone] o Mon-Fri 8:00-5:00 o Babies seen by providers at [place] [phone]) . [place] at [place], [place]; Olen Pel, MD; [place], [place], [place], [place] [address] o [phone] o Mon-Fri 8:00-5:00 o Babies seen by providers at [facility] o Does NOT accept Medicaid o Limited appointment availability, please call early in hospitalization  . Therapist, music at [place], [place]; [place], [address] [place], [place] [address] o [phone] o Mon-Fri 8:00-5:00 o Babies seen by [facility] providers o Does NOT accept Medicaid . [facility] Su Grand, MD; Guy Sandifer, MD; [place], [place]; [place], [place] [address], [place], [place] [address] o [phone] o Mon-Fri 8:00-5:00 o After hours clinic [facility] [place], [place] [address]) [phone] Mon-Fri 5:00-8:00, Sat 12:00-6:00, Sun 10:00-4:00 o Babies seen by [facility] providers o Accepting Medicaid . [place] at [facility] o [address] [place], [place]  [address] o [phone]   Fax - [phone]  [place] [phone]) . Therapist, music at [facility], MD o [address] [place], [place], [place] [address] o [phone] o Mon-Fri 8:00-5:00 o Babies seen by [facility] providers o Does NOT accept Medicaid . [place] ([place] [facility] [place]) Bing Neighbors, MD o [address] [place], [place], [place]  [address] o [phone] o Mon-Thur 8:00-7:00, Fri 8:00-5:00, Sat 8:00-12:00 o Babies seen by providers at [facility] o Accepting Medicaid - but does not have vaccinations in office (must be received elsewhere) o Limited availability, please call early in hospitalization  [place] ([phone]) . [place], MD o [address] [place] [address] o [phone]  Fax [phone]   Safe Medications in Pregnancy   Acne:  Benzoyl Peroxide  Salicylic Acid   Backache/Headache:  Tylenol: 2 regular strength every 4 hours OR        2 Extra strength every 6 hours   Colds/Coughs/Allergies:  Benadryl (alcohol free) 25 mg every 6 hours as needed  Breath right strips  Claritin  Cepacol throat lozenges  Chloraseptic throat spray  Cold-Eeze- up to three times per day  Cough drops, alcohol free  Flonase (by prescription only)  Guaifenesin  Mucinex  Robitussin DM (plain only, alcohol free)  Saline nasal spray/drops  Sudafed (pseudoephedrine) & Actifed * use only after [redacted] weeks gestation and if you do not have high blood pressure  Tylenol  Vicks Vaporub  Zinc lozenges  Zyrtec   Constipation:  Colace  Ducolax suppositories  Fleet enema  Glycerin suppositories  Metamucil  Milk of magnesia  Miralax  Senokot  Smooth move tea   Diarrhea:  Kaopectate  Imodium A-D   *NO pepto Bismol   Hemorrhoids:  Anusol  Anusol HC  Preparation H  Tucks   Indigestion:  Tums  Maalox  Mylanta  Zantac  Pepcid   Insomnia:  Benadryl (alcohol free) 55m every 6 hours as needed  Tylenol PM  Unisom, no Gelcaps   Leg Cramps:  Tums  MagGel   Nausea/Vomiting:  Bonine  Dramamine  Emetrol  Ginger extract  Sea bands  Meclizine  Nausea medication to take during pregnancy:  Unisom (doxylamine succinate 25 mg tablets) Take one tablet daily at bedtime. If symptoms are not adequately controlled, the dose can be increased to a maximum  recommended dose of two tablets daily (1/2 tablet in the morning, 1/2 tablet mid-afternoon and one at bedtime).  Vitamin B6 1029mtablets. Take one tablet twice a day (up to 200 mg per day).   Skin Rashes:  Aveeno products  Benadryl cream or 2553mvery 6 hours as needed  Calamine Lotion  1% cortisone cream   Yeast infection:  Gyne-lotrimin 7  Monistat 7    **If taking multiple medications, please check labels to avoid duplicating the same active ingredients  **take medication as directed on the label  ** Do not exceed 4000 mg of tylenol in 24 hours  **Do not take medications that contain aspirin or ibuprofen         Childbirth Education Options: [facility] Classes:  Childbirth education classes can help you get ready for a positive parenting experience. You can also meet other expectant parents and get free stuff for your baby. Each class runs for five weeks on the same night and costs $45 for the mother-to-be and her support person. Medicaid covers the cost if you are eligible. Call [phone] register. [facility] Education:  [phone] [email or web address]  Baby & Me Class: Discuss newborn & infant parenting and family adjustment issues with other new mothers in a relaxed environment. Each week brings a new speaker or baby-centered activity. We encourage new mothers to join us [place] Thursday at 11:00am. Babies birth until crawling. No registration or fee. Daddy BooWESCO Internationalhis course offers Dads-to-be the tools and knowledge needed to feel confident on their journey to becoming new fathers. Experienced dads, who have been trained as coaches, teach dads-to-be how to hold, comfort, diaper, swaddle and play with their infant while being able to support the new mom as well. A class for men taught by men. $25/dad Big Brother/Big Sister: Let your children share in the joy of a new brother or sister in this special  class designed just for them. Class includes discussion about how families care for babies: swaddling, holding, diapering, safety as well as how they can be helpful in their new role. This class is designed for children ages 2 t32 6, 4ut any age is welcome. Please register each child individually. $5/child  Mom Talk: This mom-led group offers support and connection to mothers as they journey through the adjustments and struggles of that sometimes overwhelming first year after the birth of a child. Tuesdays at 10:00am and Thursdays at 6:00pm. Babies welcome. No registration or fee. Breastfeeding Support Group: This group is a mother-to-mother support circle where moms have the opportunity to share their breastfeeding experiences. A Lactation Consultant is present for questions and concerns. Meets each Tuesday at 11:00am. No fee or registration. Breastfeeding Your Baby: Learn what to expect in the first days of breastfeeding your newborn.  This class  will help you feel more confident with the skills needed to begin your breastfeeding experience. Many new mothers are concerned about breastfeeding after leaving the hospital. This class will also address the most common fears and challenges about breastfeeding during the first few weeks, months and beyond. (call for fee) Comfort Techniques and Tour: This 2 hour interactive class will provide you the opportunity to learn & practice hands-on techniques that can help relieve some of the discomfort of labor and encourage your baby to rotate toward the best position for birth. You and your partner will be able to try a variety of labor positions with birth balls and rebozos as well as practice breathing, relaxation, and visualization techniques. A tour of the [facility] is included with this class. $20 per registrant and support person Childbirth Class- Weekend Option: This class is a Weekend version of our Birth & Baby series. It is designed  for parents who have a difficult time fitting several weeks of classes into their schedule. It covers the care of your newborn and the basics of labor and childbirth. It also includes a [place] of [facility] and lunch. The class is held two consecutive days: beginning on Friday evening from 6:30 - 8:30 p.m. and the next day, Saturday from 9 a.m. - 4 p.m. (call for fee) Doren Custard Class: Interested in a waterbirth?  This informational class will help you discover whether waterbirth is the right fit for you. Education about waterbirth itself, supplies you would need and how to assemble your support team is what you can expect from this class. Some obstetrical practices require this class in order to pursue a waterbirth. (Not all obstetrical practices offer waterbirth-check with your healthcare provider.) Register only the expectant mom, but you are encouraged to bring your partner to class! Required if planning waterbirth, no fee. Infant/Child CPR: Parents, grandparents, babysitters, and friends learn Cardio-Pulmonary Resuscitation skills for infants and children. You will also learn how to treat both conscious and unconscious choking in infants and children. This Family & Friends program does not offer certification. Register each participant individually to ensure that enough mannequins are available. (Call for fee) Grandparent Love: Expecting a grandbaby? This class is for you! Learn about the latest infant care and safety recommendations and ways to support your own child as he or she transitions into the parenting role. Taught by Registered Nurses who are childbirth instructors, but most importantly...they are grandmothers too! $10/person. Childbirth Class- Natural Childbirth: This series of 5 weekly classes is for expectant parents who want to learn and practice natural methods of coping with the process of labor and childbirth. Relaxation, breathing, massage, visualization, role of  the partner, and helpful positioning are highlighted. Participants learn how to be confident in their body's ability to give birth. This class will empower and help parents make informed decisions about their own care. Includes discussion that will help new parents transition into the immediate postpartum period. [facility] is included. We suggest taking this class between 25-32 weeks, but it's only a recommendation. $75 per registrant and one support person or $30 Medicaid. Childbirth Class- 3 week Series: This option of 3 weekly classes helps you and your labor partner prepare for childbirth. Newborn care, labor & birth, cesarean birth, pain management, and comfort techniques are discussed and a [place] of [facility] is included. The class meets at the same time, on the same day of the week  for 3 consecutive weeks beginning with the starting date you choose. $60 for registrant and one support person.  Marvelous Multiples: Expecting twins, triplets, or more? This class covers the differences in labor, birth, parenting, and breastfeeding issues that face multiples' parents. NICU tour is included. Led by a Certified Childbirth Educator who is the mother of twins. No fee. Caring for Baby: This class is for expectant and adoptive parents who want to learn and practice the most up-to-date newborn care for their babies. Focus is on birth through the first six weeks of life. Topics include feeding, bathing, diapering, crying, umbilical cord care, circumcision care and safe sleep. Parents learn to recognize symptoms of illness and when to call the pediatrician. Register only the mom-to-be and your partner or support person can plan to come with you! $10 per registrant and support person Childbirth Class- online option: This online class offers you the freedom to complete a Birth and Baby series in the comfort of your own home. The flexibility of this option  allows you to review sections at your own pace, at times convenient to you and your support people. It includes additional video information, animations, quizzes, and extended activities. Get organized with helpful eClass tools, checklists, and trackers. Once you register online for the class, you will receive an email within a few days to accept the invitation and begin the class when the time is right for you. The content will be available to you for 60 days. $60 for 60 days of online access for you and your support people.  Local Doulas: Natural Baby Doulas [email or web address] Tel: [phone] [email or web address] [employer] [phone] [email or web address] www.piedmontdoulas.com The [facility]  (also do waterbirth tub rental) [phone] [email or web address] [facility] [phone] [email or web address] NotebookDistributors.fi Sacred Rhythms  [phone] https://[email or web address]-[email or web address]/ [employer] (PADA) [email or web address] https://www.[email or web address]/ [facility]  [email or web address]/ Considering Waterbirth? Guide for patients at [facility] [employer]  Why consider waterbirth?  . Gentle birth for babies . Less pain medicine used in labor . May allow for passive descent/less pushing . May reduce perineal tears  . More mobility and instinctive maternal position changes . Increased maternal relaxation . Reduced blood pressure in labor  Is waterbirth safe? What are the risks of infection, drowning or other complications?  . Infection: o Very low risk (3.7 % for tub vs 4.8% for bed) o 7 in 8000 waterbirths with documented infection o Poorly cleaned equipment most common cause o Slightly lower group B strep transmission rate  . Drowning o Maternal:  - Very low risk   - Related to seizures or fainting o Newborn:   - Very low risk. No evidence of increased risk of respiratory problems in multiple large studies - Physiological protection from breathing under water - Avoid underwater birth if there are any fetal complications - Once baby's head is out of the water, keep it out.  . Birth complication o Some reports of cord trauma, but risk decreased by bringing baby to surface gradually o No evidence of increased risk of shoulder dystocia. Mothers can usually change positions faster in water than in a bed, possibly aiding the maneuvers to free the shoulder.   You must attend a Doren Custard class at [facility]  3rd Wednesday of every month from 7-9pm  [employer] by calling [phone] or online at [email or web address]  Bring [place] the certificate from the class to your prenatal appointment  Meet with a midwife at 36 weeks to see if you can still  plan a waterbirth and to sign the consent.   Purchase or rent the following supplies:   Water Birth Pool (Birth Pool in a Box or LaBassine for instance)  (Tubs start ~$125)  Single-use disposable tub liner designed for your brand of tub  New garden hose labeled "lead-free", "suitable for drinking water",  Electric drain pump to remove water (We recommend 792 gallon per hour or greater pump.)   Separate garden hose to remove the dirty water  Fish net  Bathing suit top (optional)  Long-handled mirror (optional)  Places to purchase or rent supplies  Yourwaterbirth.com for tub purchases and supplies  Waterbirthsolutions.com for tub purchases and supplies  The Labor Ladies (www.thelaborladies.com) $275 for tub rental/set-up & take down/kit   [facility] (http://www.padanc.org/MeetUs.htm) Information regarding doulas (labor support) who provide pool rentals  Our practice has a Birth Pool in a Box tub at the hospital that you may borrow on a first-come-first-served basis. It is your responsibility to to set up, clean  and break down the tub. We cannot guarantee the availability of this tub in advance. You are responsible for bringing all accessories listed above. If you do not have all necessary supplies you cannot have a waterbirth.    Things that would prevent you from having a waterbirth:  Premature, <37wks  Previous cesarean birth  Presence of thick meconium-stained fluid  Multiple gestation (Twins, triplets, etc.)  Uncontrolled diabetes or gestational diabetes requiring medication  Hypertension requiring medication or diagnosis of pre-eclampsia  Heavy vaginal bleeding  Non-reassuring fetal heart rate  Active infection (MRSA, etc.). Group B Strep is NOT a contraindication for  waterbirth.  If your labor has to be induced and induction method requires continuous  monitoring of the baby's heart rate  Other risks/issues identified by your obstetrical provider  Please remember that birth is unpredictable. Under certain unforeseeable circumstances your provider may advise against giving birth in the tub. These decisions will be made on a case-by-case basis and with the safety of you and your baby as our highest priority.      

## 2019-07-16 LAB — OBSTETRIC PANEL, INCLUDING HIV
Antibody Screen: NEGATIVE
Basophils Absolute: 0 10*3/uL (ref 0.0–0.2)
Basos: 0 %
EOS (ABSOLUTE): 0.1 10*3/uL (ref 0.0–0.4)
Eos: 1 %
HIV Screen 4th Generation wRfx: NONREACTIVE
Hematocrit: 33.5 % — ABNORMAL LOW (ref 34.0–46.6)
Hemoglobin: 11.2 g/dL (ref 11.1–15.9)
Hepatitis B Surface Ag: NEGATIVE
Immature Grans (Abs): 0 10*3/uL (ref 0.0–0.1)
Immature Granulocytes: 0 %
Lymphocytes Absolute: 1.7 10*3/uL (ref 0.7–3.1)
Lymphs: 35 %
MCH: 29.9 pg (ref 26.6–33.0)
MCHC: 33.4 g/dL (ref 31.5–35.7)
MCV: 90 fL (ref 79–97)
Monocytes Absolute: 0.6 10*3/uL (ref 0.1–0.9)
Monocytes: 11 %
Neutrophils Absolute: 2.5 10*3/uL (ref 1.4–7.0)
Neutrophils: 53 %
Platelets: 259 10*3/uL (ref 150–450)
RBC: 3.74 x10E6/uL — ABNORMAL LOW (ref 3.77–5.28)
RDW: 12.6 % (ref 11.7–15.4)
RPR Ser Ql: NONREACTIVE
Rh Factor: POSITIVE
Rubella Antibodies, IGG: 5.5 index (ref >0.99)
WBC: 4.9 10*3/uL (ref 3.4–10.8)

## 2019-07-16 LAB — HEMOGLOBIN A1C
Est. average glucose Bld gHb Est-mCnc: 97 mg/dL
Hgb A1c MFr Bld: 5 % (ref 4.8–5.6)

## 2019-07-17 LAB — URINE CULTURE, OB REFLEX

## 2019-07-17 LAB — CULTURE, OB URINE

## 2019-07-22 ENCOUNTER — Other Ambulatory Visit: Payer: Self-pay | Admitting: *Deleted

## 2019-07-22 DIAGNOSIS — O219 Vomiting of pregnancy, unspecified: Secondary | ICD-10-CM

## 2019-07-22 LAB — CYTOLOGY - PAP
Adequacy: ABSENT
Chlamydia: NEGATIVE
Comment: NEGATIVE
Comment: NORMAL
Diagnosis: NEGATIVE
Neisseria Gonorrhea: NEGATIVE

## 2019-07-22 MED ORDER — PROMETHAZINE HCL 25 MG PO TABS: 25.0000 mg | ORAL_TABLET | Freq: Four times a day (QID) | ORAL | 0 refills | Status: DC | PRN

## 2019-07-30 ENCOUNTER — Encounter: Payer: Self-pay | Admitting: *Deleted

## 2019-07-31 ENCOUNTER — Encounter: Payer: Self-pay | Admitting: *Deleted

## 2019-08-21 ENCOUNTER — Encounter: Payer: Self-pay | Admitting: *Deleted

## 2019-09-08 ENCOUNTER — Other Ambulatory Visit (HOSPITAL_COMMUNITY): Payer: Self-pay | Admitting: *Deleted

## 2019-09-08 ENCOUNTER — Other Ambulatory Visit: Payer: Self-pay

## 2019-09-08 ENCOUNTER — Ambulatory Visit (HOSPITAL_COMMUNITY)
Admission: RE | Admit: 2019-09-08 | Discharge: 2019-09-08 | Disposition: A | Discharge status: Home / Self Care | Payer: Medicaid Other | Source: Ambulatory Visit | Attending: Obstetrics and Gynecology | Admitting: Obstetrics and Gynecology

## 2019-09-08 DIAGNOSIS — Z363 Encounter for antenatal screening for malformations: Secondary | ICD-10-CM | POA: Diagnosis not present

## 2019-09-08 DIAGNOSIS — Z3A19 19 weeks gestation of pregnancy: Secondary | ICD-10-CM

## 2019-09-08 DIAGNOSIS — Z362 Encounter for other antenatal screening follow-up: Secondary | ICD-10-CM

## 2019-09-08 DIAGNOSIS — Z3490 Encounter for supervision of normal pregnancy, unspecified, unspecified trimester: Secondary | ICD-10-CM | POA: Diagnosis not present

## 2019-09-09 ENCOUNTER — Encounter: Payer: Medicaid Other | Admitting: Obstetrics and Gynecology

## 2019-09-19 NOTE — L&D Delivery Note (Signed)
OB/GYN Faculty Practice Delivery Note  Lisa Cook is a 24 y.o. [id number] s/p NSVD at [redacted]w[redacted]d. She was admitted for IOL for pre-eclampsia with severe features by headache.   ROM: 10h 77m with clear fluid GBS Status: unknown   Maximum Maternal Temperature: 98.8 F    Labor Progress: . Patient arrived at 0 cm dilation and was induced with misoprostol x3 and foley bulb, had SROM at time of FB coming out and then augmented with pitocin and progressed to complete.   Delivery Date/Time: 01/09/2020 at 0346 Delivery: Called to room and patient was complete and pushing. Head delivered in LOA position. Loose nuchal x1 was reduced. Shoulder did not delivery with ease and a shoulder dystocia was identified. Initially attempted to hook the anterior axilla and provide traction however no significant movement. Rotational maneuvers then tried without success. Attempt made for the posterior arm also unsuccessful. Suprapubic pressure then directed which relieved the dystocia and subsequently anterior and posterior shoulders delivered followed by the body Total time of dystocia 1 minute and 15 seconds. Infant with spontaneous cry, placed on mother's abdomen, dried and stimulated. Cord clamped x 2 after 1-minute delay, and cut by FOB. Cord blood drawn. Placenta delivered spontaneously with gentle cord traction. Fundus firm with massage and Pitocin. Labia, perineum, vagina, and cervix inspected with no lacerations.   Placenta: 3v intact to L&D Complications: shoulder dystocia, duration 1 min and 15 sec Lacerations: none EBL: 600 cc Analgesia: epidural   Infant: APGAR (1 MIN):  7 APGAR (5 MINS):  9  Weight: 2895 grams  Zack Seal, MD/MP[facility] OB/GYN Fellow, Faculty Practice

## 2019-09-25 ENCOUNTER — Telehealth: Payer: Self-pay | Admitting: Obstetrics & Gynecology

## 2019-09-25 NOTE — Telephone Encounter (Signed)
Called the patient to inform of the upcoming visit. The patient verbalized understanding °

## 2019-09-30 ENCOUNTER — Encounter: Payer: Self-pay | Admitting: Nurse Practitioner

## 2019-09-30 ENCOUNTER — Ambulatory Visit (INDEPENDENT_AMBULATORY_CARE_PROVIDER_SITE_OTHER): Payer: Medicaid Other | Admitting: Nurse Practitioner

## 2019-09-30 ENCOUNTER — Other Ambulatory Visit: Payer: Self-pay

## 2019-09-30 VITALS — BP 106/65 | HR 78 | Wt 154.0 lb

## 2019-09-30 DIAGNOSIS — Z3490 Encounter for supervision of normal pregnancy, unspecified, unspecified trimester: Secondary | ICD-10-CM

## 2019-09-30 DIAGNOSIS — Z3A22 22 weeks gestation of pregnancy: Secondary | ICD-10-CM

## 2019-09-30 DIAGNOSIS — Z3492 Encounter for supervision of normal pregnancy, unspecified, second trimester: Secondary | ICD-10-CM

## 2019-09-30 NOTE — Progress Notes (Signed)
    Subjective:  Lisa Cook is a 24 y.o. G3P0020 at [redacted]w[redacted]d being seen today for ongoing prenatal care.  She is currently monitored for the following issues for this low-risk pregnancy and has Supervision of low-risk pregnancy on their problem list.  Patient reports no complaints.  Contractions: Not present. Vag. Bleeding: None.  Movement: Present. Denies leaking of fluid.   The following portions of the patient's history were reviewed and updated as appropriate: allergies, current medications, past family history, past medical history, past social history, past surgical history and problem list. Problem list updated.  Objective:   Vitals:   09/30/19 1001  BP: 106/65  Pulse: 78  Weight: 154 lb (69.9 kg)    Fetal Status: Fetal Heart Rate (bpm): 154 Fundal Height: 22 cm Movement: Present     General:  Alert, oriented and cooperative. Patient is in no acute distress.  Skin: Skin is warm and dry. No rash noted.   Cardiovascular: Normal heart rate noted  Respiratory: Normal respiratory effort, no problems with respiration noted  Abdomen: Soft, gravid, appropriate for gestational age. Pain/Pressure: Absent     Pelvic:  Cervical exam deferred        Extremities: Normal range of motion.  Edema: None  Mental Status: Normal mood and affect. Normal behavior. Normal judgment and thought content.   Urinalysis:      Assessment and Plan:  Pregnancy: G3P0020 at [redacted]w[redacted]d  1. Encounter for supervision of low-risk pregnancy, antepartum Has BP cuff and advised to take BP weekly and enter into Babyscripts.  Preterm labor symptoms and general obstetric precautions including but not limited to vaginal bleeding, contractions, leaking of fluid and fetal movement were reviewed in detail with the patient. Please refer to After Visit Summary for other counseling recommendations.  Return in about 6 weeks (around 11/11/2019) for in person ROB - early AM is fasting.  Nolene Bernheim, RN, MSN, NP-BC Nurse  Practitioner, [facility] for [employer], [facility] 09/30/2019 8:11 PM

## 2019-09-30 NOTE — Patient Instructions (Signed)

## 2019-10-02 LAB — AFP, SERUM, OPEN SPINA BIFIDA
AFP MoM: 0.78
AFP Value: 64.6 ng/mL
Gest. Age on Collection Date: 22.2 weeks
Maternal Age At EDD: 22.4 yr
OSBR Risk 1 IN: 10000
Test Results:: NEGATIVE
Weight: 154 [lb_av]

## 2019-10-08 ENCOUNTER — Ambulatory Visit (HOSPITAL_COMMUNITY): Payer: Medicaid Other

## 2019-10-08 ENCOUNTER — Other Ambulatory Visit: Payer: Self-pay

## 2019-10-08 ENCOUNTER — Ambulatory Visit (HOSPITAL_COMMUNITY)
Admission: RE | Admit: 2019-10-08 | Discharge: 2019-10-08 | Disposition: A | Discharge status: Home / Self Care | Payer: Medicaid Other | Source: Ambulatory Visit | Attending: Obstetrics and Gynecology | Admitting: Obstetrics and Gynecology

## 2019-10-08 DIAGNOSIS — Z3A23 23 weeks gestation of pregnancy: Secondary | ICD-10-CM | POA: Diagnosis not present

## 2019-10-08 DIAGNOSIS — Z362 Encounter for other antenatal screening follow-up: Secondary | ICD-10-CM | POA: Insufficient documentation

## 2019-11-10 ENCOUNTER — Other Ambulatory Visit: Payer: Self-pay | Admitting: *Deleted

## 2019-11-10 DIAGNOSIS — Z3490 Encounter for supervision of normal pregnancy, unspecified, unspecified trimester: Secondary | ICD-10-CM

## 2019-11-11 ENCOUNTER — Other Ambulatory Visit: Payer: Medicaid Other

## 2019-11-11 ENCOUNTER — Other Ambulatory Visit: Payer: Self-pay

## 2019-11-11 ENCOUNTER — Encounter: Payer: Self-pay | Admitting: Medical

## 2019-11-11 ENCOUNTER — Ambulatory Visit (INDEPENDENT_AMBULATORY_CARE_PROVIDER_SITE_OTHER): Payer: Medicaid Other | Admitting: Medical

## 2019-11-11 DIAGNOSIS — Z3493 Encounter for supervision of normal pregnancy, unspecified, third trimester: Secondary | ICD-10-CM

## 2019-11-11 DIAGNOSIS — Z23 Encounter for immunization: Secondary | ICD-10-CM

## 2019-11-11 DIAGNOSIS — Z3A28 28 weeks gestation of pregnancy: Secondary | ICD-10-CM

## 2019-11-11 DIAGNOSIS — Z3490 Encounter for supervision of normal pregnancy, unspecified, unspecified trimester: Secondary | ICD-10-CM

## 2019-11-11 NOTE — Patient Instructions (Addendum)
Fetal Movement Counts Patient Name: ________________________________________________ Patient Due Date: ____________________ What is a fetal movement count?  A fetal movement count is the number of times that you feel your baby move during a certain amount of time. This may also be called a fetal kick count. A fetal movement count is recommended for every pregnant woman. You may be asked to start counting fetal movements as early as week 28 of your pregnancy. Pay attention to when your baby is most active. You may notice your baby's sleep and wake cycles. You may also notice things that make your baby move more. You should do a fetal movement count:  When your baby is normally most active.  At the same time each day. A good time to count movements is while you are resting, after having something to eat and drink. How do I count fetal movements? 1. Find a quiet, comfortable area. Sit, or lie down on your side. 2. Write down the date, the start time and stop time, and the number of movements that you felt between those two times. Take this information with you to your health care visits. 3. Write down your start time when you feel the first movement. 4. Count kicks, flutters, swishes, rolls, and jabs. You should feel at least 10 movements. 5. You may stop counting after you have felt 10 movements, or if you have been counting for 2 hours. Write down the stop time. 6. If you do not feel 10 movements in 2 hours, contact your health care provider for further instructions. Your health care provider may want to do additional tests to assess your baby's well-being. Contact a health care provider if:  You feel fewer than 10 movements in 2 hours.  Your baby is not moving like he or she usually does. Date: ____________ Start time: ____________ Stop time: ____________ Movements: ____________ Date: ____________ Start time: ____________ Stop time: ____________ Movements: ____________ Date: ____________  Start time: ____________ Stop time: ____________ Movements: ____________ Date: ____________ Start time: ____________ Stop time: ____________ Movements: ____________ Date: ____________ Start time: ____________ Stop time: ____________ Movements: ____________ Date: ____________ Start time: ____________ Stop time: ____________ Movements: ____________ Date: ____________ Start time: ____________ Stop time: ____________ Movements: ____________ Date: ____________ Start time: ____________ Stop time: ____________ Movements: ____________ Date: ____________ Start time: ____________ Stop time: ____________ Movements: ____________ This information is not intended to replace advice given to you by your health care provider. Make sure you discuss any questions you have with your health care provider. Document Revised: 04/24/2019 Document Reviewed: 04/24/2019 Elsevier Patient Education  2020 Elsevier Inc. Braxton Hicks Contractions Contractions of the uterus can occur throughout pregnancy, but they are not always a sign that you are in labor. You may have practice contractions called Braxton Hicks contractions. These false labor contractions are sometimes confused with true labor. What are Braxton Hicks contractions? Braxton Hicks contractions are tightening movements that occur in the muscles of the uterus before labor. Unlike true labor contractions, these contractions do not result in opening (dilation) and thinning of the cervix. Toward the end of pregnancy (32-34 weeks), Braxton Hicks contractions can happen more often and may become stronger. These contractions are sometimes difficult to tell apart from true labor because they can be very uncomfortable. You should not feel embarrassed if you go to the hospital with false labor. Sometimes, the only way to tell if you are in true labor is for your health care provider to look for changes in the cervix. The health care provider   will do a physical exam and may  monitor your contractions. If you are not in true labor, the exam should show that your cervix is not dilating and your water has not broken. If there are no other health problems associated with your pregnancy, it is completely safe for you to be sent home with false labor. You may continue to have Braxton Hicks contractions until you go into true labor. How to tell the difference between true labor and false labor True labor  Contractions last 30-70 seconds.  Contractions become very regular.  Discomfort is usually felt in the top of the uterus, and it spreads to the lower abdomen and low back.  Contractions do not go away with walking.  Contractions usually become more intense and increase in frequency.  The cervix dilates and gets thinner. False labor  Contractions are usually shorter and not as strong as true labor contractions.  Contractions are usually irregular.  Contractions are often felt in the front of the lower abdomen and in the groin.  Contractions may go away when you walk around or change positions while lying down.  Contractions get weaker and are shorter-lasting as time goes on.  The cervix usually does not dilate or become thin. Follow these instructions at home:   Take over-the-counter and prescription medicines only as told by your health care provider.  Keep up with your usual exercises and follow other instructions from your health care provider.  Eat and drink lightly if you think you are going into labor.  If Braxton Hicks contractions are making you uncomfortable: ? Change your position from lying down or resting to walking, or change from walking to resting. ? Sit and rest in a tub of warm water. ? Drink enough fluid to keep your urine pale yellow. Dehydration may cause these contractions. ? Do slow and deep breathing several times an hour.  Keep all follow-up prenatal visits as told by your health care provider. This is important. Contact a  health care provider if:  You have a fever.  You have continuous pain in your abdomen. Get help right away if:  Your contractions become stronger, more regular, and closer together.  You have fluid leaking or gushing from your vagina.  You pass blood-tinged mucus (bloody show).  You have bleeding from your vagina.  You have low back pain that you never had before.  You feel your baby's head pushing down and causing pelvic pressure.  Your baby is not moving inside you as much as it used to. Summary  Contractions that occur before labor are called Braxton Hicks contractions, false labor, or practice contractions.  Braxton Hicks contractions are usually shorter, weaker, farther apart, and less regular than true labor contractions. True labor contractions usually become progressively stronger and regular, and they become more frequent.  Manage discomfort from Braxton Hicks contractions by changing position, resting in a warm bath, drinking plenty of water, or practicing deep breathing. This information is not intended to replace advice given to you by your health care provider. Make sure you discuss any questions you have with your health care provider. Document Revised: 08/17/2017 Document Reviewed: 01/18/2017 Elsevier Patient Education  2020 Elsevier Inc.  Places to have your son circumcised:                                                                        [facility]     [phone]   $480 while you are in hospital         [facility]              [phone]   $269 by 4 wks                      [facility]                     [phone]   $269 by 7 days [facility]                    [phone]   $269 by 4 wks [facility]             [phone]   $225 by 2 wks    These prices sometimes change but are roughly what you can expect to pay. Please call and confirm pricing.   Circumcision is considered an elective/non-medically necessary procedure. There are many reasons parents decide to have  their sons circumsized. During the first year of life circumcised males have a reduced risk of urinary tract infections but after this year the rates between circumcised males and uncircumcised males are the same.  It is safe to have your son circumcised outside of the hospital and the places above perform them regularly.   Deciding about Circumcision in Baby Boys  (Up-to-date The Basics)  What is circumcision?   Circumcision is a surgery that removes the skin that covers the tip of the penis, called the "foreskin" Circumcision is usually done when a boy is between 1 and 10 days old. In the [place], circumcision is common. In some other countries, fewer boys are circumcised. Circumcision is a common tradition in some religions.  Should I have my baby boy circumcised?   There is no easy answer. Circumcision has some benefits. But it also has risks. After talking with your doctor, you will have to decide for yourself what is right for your family.  What are the benefits of circumcision?   Circumcised boys seem to have slightly lower rates of: ?Urinary tract infections ?Swelling of the opening at the tip of the penis Circumcised men seem to have slightly lower rates of: ?Urinary tract infections ?Swelling of the opening at the tip of the penis ?Penis cancer ?HIV and other infections that you catch during sex ?Cervical cancer in the women they have sex with Even so, in the [place], the risks of these problems are small - even in boys and men who have not been circumcised. Plus, boys and men who are not circumcised can reduce these extra risks by: ?Cleaning their penis well ?Using condoms during sex  What are the risks of circumcision?  Risks include: ?Bleeding or infection from the surgery ?Damage to or amputation of the penis ?A chance that the doctor will cut off too much or not enough of the foreskin ?A chance that sex won't feel as good later in life Only about 1 out of  every 200 circumcisions leads to problems. There is also a chance that your health insurance won't pay for circumcision.  How is circumcision done in baby boys?  First, the baby gets medicine for pain relief. This might be a cream on the skin or a shot into the base of the penis. Next, the doctor cleans the baby's penis well. Then he or she uses special tools to cut off the foreskin. Finally, the doctor   wraps a bandage (called gauze) around the baby's penis. If you have your baby circumcised, his doctor or nurse will give you instructions on how to care for him after the surgery. It is important that you follow those instructions carefully.  

## 2019-11-11 NOTE — Progress Notes (Signed)
   PRENATAL VISIT NOTE  Subjective:  Lisa Cook is a 24 y.o. G3P0020 at [redacted]w[redacted]d being seen today for ongoing prenatal care.  She is currently monitored for the following issues for this low-risk pregnancy and has Supervision of low-risk pregnancy on their problem list.  Patient reports no complaints.  Contractions: Not present. Vag. Bleeding: None.  Movement: Present. Denies leaking of fluid.   The following portions of the patient's history were reviewed and updated as appropriate: allergies, current medications, past family history, past medical history, past social history, past surgical history and problem list.   Objective:   Vitals:   11/11/19 0823  BP: 109/71  Pulse: 86  Weight: 171 lb (77.6 kg)    Fetal Status: Fetal Heart Rate (bpm): 154 Fundal Height: 28 cm Movement: Present     General:  Alert, oriented and cooperative. Patient is in no acute distress.  Skin: Skin is warm and dry. No rash noted.   Cardiovascular: Normal heart rate noted  Respiratory: Normal respiratory effort, no problems with respiration noted  Abdomen: Soft, gravid, appropriate for gestational age.  Pain/Pressure: Absent     Pelvic: Cervical exam deferred        Extremities: Normal range of motion.  Edema: None  Mental Status: Normal mood and affect. Normal behavior. Normal judgment and thought content.   Assessment and Plan:  Pregnancy: G3P0020 at [redacted]w[redacted]d 1. Encounter for supervision of low-risk pregnancy, antepartum - Doing well - 2 hour GTT, CBC, HIV and RPR today  - MOC - deciding between Nexplanon and IUD - would both be available inpatient  - Discussed options for circumcisions, planning inpatient, cost information given  - Peds list provided and importance of choosing a pediatrician prior to 36 weeks discussed   Preterm labor symptoms and general obstetric precautions including but not limited to vaginal bleeding, contractions, leaking of fluid and fetal movement were reviewed in detail  with the patient. Please refer to After Visit Summary for other counseling recommendations.   Return in about 4 weeks (around 12/09/2019) for LOB, Virtual, Babyscipts.  Future Appointments  Date Time Provider Department Center  12/09/2019 10:15 AM Armando Reichert, CNM [facility]    Vonzella Nipple, PA-C

## 2019-11-12 LAB — CBC
Hematocrit: 31.9 % — ABNORMAL LOW (ref 34.0–46.6)
Hemoglobin: 10.6 g/dL — ABNORMAL LOW (ref 11.1–15.9)
MCH: 29.7 pg (ref 26.6–33.0)
MCHC: 33.2 g/dL (ref 31.5–35.7)
MCV: 89 fL (ref 79–97)
Platelets: 256 10*3/uL (ref 150–450)
RBC: 3.57 x10E6/uL — ABNORMAL LOW (ref 3.77–5.28)
RDW: 12.2 % (ref 11.7–15.4)
WBC: 10.1 10*3/uL (ref 3.4–10.8)

## 2019-11-12 LAB — GLUCOSE TOLERANCE, 2 HOURS W/ 1HR
Glucose, 1 hour: 142 mg/dL (ref 65–179)
Glucose, 2 hour: 91 mg/dL (ref 65–152)
Glucose, Fasting: 80 mg/dL (ref 65–91)

## 2019-11-12 LAB — RPR: RPR Ser Ql: NONREACTIVE

## 2019-11-12 LAB — HIV ANTIBODY (ROUTINE TESTING W REFLEX): HIV Screen 4th Generation wRfx: NONREACTIVE

## 2019-12-03 ENCOUNTER — Encounter: Payer: Self-pay | Admitting: Medical

## 2019-12-09 ENCOUNTER — Telehealth (INDEPENDENT_AMBULATORY_CARE_PROVIDER_SITE_OTHER): Payer: Medicaid Other | Admitting: Advanced Practice Midwife

## 2019-12-09 ENCOUNTER — Other Ambulatory Visit: Payer: Self-pay

## 2019-12-09 DIAGNOSIS — Z3A32 32 weeks gestation of pregnancy: Secondary | ICD-10-CM

## 2019-12-09 DIAGNOSIS — Z3490 Encounter for supervision of normal pregnancy, unspecified, unspecified trimester: Secondary | ICD-10-CM

## 2019-12-09 DIAGNOSIS — Z3493 Encounter for supervision of normal pregnancy, unspecified, third trimester: Secondary | ICD-10-CM

## 2019-12-09 MED ORDER — COMFORT FIT MATERNITY SUPP MED MISC: 1.0000 | Freq: Every day | 0 refills | Status: DC | PRN

## 2019-12-09 NOTE — Progress Notes (Signed)
   TELEHEALTH VIRTUAL OBSTETRICS VISIT ENCOUNTER NOTE  I connected with Karen Chafe on 12/09/19 at 10:15 AM EDT by telephone at home and verified that I am speaking with the correct person using two identifiers.   I discussed the limitations, risks, security and privacy concerns of performing an evaluation and management service by telephone and the availability of in person appointments. I also discussed with the patient that there may be a patient responsible charge related to this service. The patient expressed understanding and agreed to proceed.  Subjective:  Lisa Cook is a 24 y.o. G3P0020 at [redacted]w[redacted]d being followed for ongoing prenatal care.  She is currently monitored for the following issues for this low-risk pregnancy and has Supervision of low-risk pregnancy on their problem list.  Patient reports no complaints. Reports fetal movement. Denies any contractions, bleeding or leaking of fluid.   The following portions of the patient's history were reviewed and updated as appropriate: allergies, current medications, past family history, past medical history, past social history, past surgical history and problem list.   Objective:   General:  Alert, oriented and cooperative.   Mental Status: Normal mood and affect perceived. Normal judgment and thought content.  Rest of physical exam deferred due to type of encounter  Assessment and Plan:  Pregnancy: G3P0020 at [redacted]w[redacted]d 1. Encounter for supervision of low-risk pregnancy, antepartum - routine care - RX for support belt printed and will leave at front desk for patient to pick up.   Preterm labor symptoms and general obstetric precautions including but not limited to vaginal bleeding, contractions, leaking of fluid and fetal movement were reviewed in detail with the patient.  I discussed the assessment and treatment plan with the patient. The patient was provided an opportunity to ask questions and all were answered. The patient  agreed with the plan and demonstrated an understanding of the instructions. The patient was advised to call back or seek an in-person office evaluation/go to [facility] at [facility] for any urgent or concerning symptoms. Please refer to After Visit Summary for other counseling recommendations.   I provided 12 minutes of non-face-to-face time during this encounter.  Return in about 4 weeks (around 01/06/2020) for in person visit for 36 week labs and GBS .  Future Appointments  Date Time Provider Department Center  12/16/2019 12:15 PM Klett, Pascal Lux, NP [facility]    Thressa Sheller DNP, CNM  12/09/19  10:37 AM  [facility] [employer], [facility]

## 2019-12-09 NOTE — Progress Notes (Signed)
I connected with  Karen Chafe on 12/09/19 at 10:15 AM EDT by telephone and verified that I am speaking with the correct person using two identifiers.   I discussed the limitations, risks, security and privacy concerns of performing an evaluation and management service by telephone and the availability of in person appointments. I also discussed with the patient that there may be a patient responsible charge related to this service. The patient expressed understanding and agreed to proceed.  Janene Madeira Dayanara Sherrill, CMA 12/09/2019  10:13 AM

## 2019-12-16 ENCOUNTER — Ambulatory Visit (INDEPENDENT_AMBULATORY_CARE_PROVIDER_SITE_OTHER): Payer: Self-pay | Admitting: Pediatrics

## 2019-12-16 ENCOUNTER — Other Ambulatory Visit: Payer: Self-pay

## 2019-12-16 ENCOUNTER — Encounter: Payer: Self-pay | Admitting: Pediatrics

## 2019-12-16 DIAGNOSIS — Z7681 Expectant parent(s) prebirth pediatrician visit: Secondary | ICD-10-CM | POA: Insufficient documentation

## 2019-12-16 NOTE — Progress Notes (Signed)
Prenatal counseling for impending newborn done. Mother due 02/01/2020. Low risk.

## 2019-12-29 ENCOUNTER — Encounter (HOSPITAL_COMMUNITY): Payer: Self-pay | Admitting: Obstetrics & Gynecology

## 2019-12-29 ENCOUNTER — Inpatient Hospital Stay (HOSPITAL_COMMUNITY)
Admission: AD | Admit: 2019-12-29 | Discharge: 2019-12-29 | Disposition: A | Discharge status: Home / Self Care | Payer: Medicaid Other | Attending: Obstetrics & Gynecology | Admitting: Obstetrics & Gynecology

## 2019-12-29 ENCOUNTER — Other Ambulatory Visit: Payer: Self-pay

## 2019-12-29 DIAGNOSIS — O4703 False labor before 37 completed weeks of gestation, third trimester: Secondary | ICD-10-CM | POA: Diagnosis not present

## 2019-12-29 DIAGNOSIS — R109 Unspecified abdominal pain: Secondary | ICD-10-CM | POA: Diagnosis present

## 2019-12-29 DIAGNOSIS — Z3A35 35 weeks gestation of pregnancy: Secondary | ICD-10-CM | POA: Diagnosis not present

## 2019-12-29 DIAGNOSIS — O479 False labor, unspecified: Secondary | ICD-10-CM

## 2019-12-29 DIAGNOSIS — Z3689 Encounter for other specified antenatal screening: Secondary | ICD-10-CM

## 2019-12-29 DIAGNOSIS — Z3490 Encounter for supervision of normal pregnancy, unspecified, unspecified trimester: Secondary | ICD-10-CM

## 2019-12-29 DIAGNOSIS — Z3493 Encounter for supervision of normal pregnancy, unspecified, third trimester: Secondary | ICD-10-CM

## 2019-12-29 DIAGNOSIS — O26893 Other specified pregnancy related conditions, third trimester: Secondary | ICD-10-CM

## 2019-12-29 LAB — WET PREP, GENITAL
Sperm: NONE SEEN
Trich, Wet Prep: NONE SEEN
Yeast Wet Prep HPF POC: NONE SEEN

## 2019-12-29 LAB — URINALYSIS, ROUTINE W REFLEX MICROSCOPIC
Bilirubin Urine: NEGATIVE
Glucose, UA: NEGATIVE mg/dL
Hgb urine dipstick: NEGATIVE
Ketones, ur: NEGATIVE mg/dL
Leukocytes,Ua: NEGATIVE
Nitrite: NEGATIVE
Protein, ur: NEGATIVE mg/dL
Specific Gravity, Urine: 1.008 (ref 1.005–1.030)
pH: 7 (ref 5.0–8.0)

## 2019-12-29 LAB — GC/CHLAMYDIA PROBE AMP (~~LOC~~) NOT AT ARMC
Chlamydia: NEGATIVE
Comment: NEGATIVE
Comment: NORMAL
Neisseria Gonorrhea: NEGATIVE

## 2019-12-29 MED ORDER — ACETAMINOPHEN 500 MG PO TABS
1000.0000 mg | ORAL_TABLET | Freq: Four times a day (QID) | ORAL | Status: DC | PRN
  Administered 2019-12-29: 1000 mg via ORAL
  Filled 2019-12-29: qty 2

## 2019-12-29 MED ORDER — CYCLOBENZAPRINE HCL 5 MG PO TABS
10.0000 mg | ORAL_TABLET | Freq: Three times a day (TID) | ORAL | Status: DC | PRN
  Administered 2019-12-29: 10 mg via ORAL
  Filled 2019-12-29: qty 2

## 2019-12-29 NOTE — MAU Note (Addendum)
Abd pain tightnings come and go- started an hour ago.  No bleeding. More discharge today, real milky.  Baby moving well. No leaking. Frequent urination today and loose bowel movements, not watery.

## 2019-12-29 NOTE — MAU Provider Note (Addendum)
History     CSN: [id number]  Arrival date and time: 12/29/19 0056   First Provider Initiated Contact with Patient 12/29/19 0148      Chief Complaint  Patient presents with  . Abdominal Pain   24 y.o. [id number] @35 .1 wks presenting with abdominal pain. Reports onset a few days ago but only lasted for 30 min. Today the pain started around 0030. Describes as tightening in lower abdomen and intermittent. She is unsure if pain is ctx. Denies VB or LOF but reports milky discharge. No itching or malodor. +FM. Denies urinary sx except frequency.    OB History    Gravida  3   Para      Term      Preterm      AB  2   Living        SAB  1   TAB  1   Ectopic      Multiple      Live Births              Past Medical History:  Diagnosis Date  . Decreased appetite 11/29/2012  . Inguinal hernia 11/2012   right  . Medical history non-contributory     Past Surgical History:  Procedure Laterality Date  . INGUINAL HERNIA REPAIR Right 12/05/2012   Procedure: RIGHT INGUINAL HERNIA REPAIR WITH LAP LOOK ON LEFT SIDE FOR POSSIBLE REPAIR;  Surgeon: Jerilynn Mages. Gerald Stabs, MD;  Location: [facility];  Service: Pediatrics;  Laterality: Right;  Rigth inguinal hernia repair with laparoscopic look on left (no hernia)    Family History  Problem Relation Age of Onset  . Diabetes Maternal Aunt   . Diabetes Maternal Grandmother   . Hypertension Maternal Grandmother   . Heart disease Maternal Grandmother   . Kidney disease Maternal Grandmother        renal failure  . Asthma Maternal Grandmother   . Asthma Brother   . Sickle cell trait Brother   . Sickle cell trait Sister     Social History   Tobacco Use  . Smoking status: Never Smoker  . Smokeless tobacco: Never Used  Substance Use Topics  . Alcohol use: Not Currently    Comment: occasionally  . Drug use: Yes    Types: Marijuana    Comment: trying to stop- currently once daily    Allergies: No Known  Allergies  Medications Prior to Admission  Medication Sig Dispense Refill Last Dose  . Prenatal Vit-Fe Fumarate-FA (PREPLUS) 27-1 MG TABS Take 1 tablet by mouth daily. 30 tablet 9 12/28/2019 at Unknown time  . Blood Pressure Monitoring (BLOOD PRESSURE KIT) DEVI 1 Device by Does not apply route as needed. 1 Device 0   . Elastic Bandages & Supports (COMFORT FIT MATERNITY SUPP MED) MISC 1 Device by Does not apply route daily as needed. 1 each 0     Review of Systems  Constitutional: Negative for chills and fever.  Gastrointestinal: Positive for abdominal pain and diarrhea. Negative for nausea and vomiting.  Genitourinary: Positive for frequency and vaginal discharge. Negative for dysuria, hematuria and vaginal bleeding.   Physical Exam   Blood pressure (!) 144/82, pulse 72, temperature 98.4 F (36.9 C), temperature source Oral, resp. rate 16, height 5' 5"  (1.651 m), weight 83.9 kg, last menstrual period 04/27/2019, SpO2 99 %, unknown if currently breastfeeding.  Physical Exam  Nursing note and vitals reviewed. Constitutional: She is oriented to person, place, and time. She appears well-developed and well-nourished. No distress.  HENT:  Head: Normocephalic and atraumatic.  Cardiovascular: Normal rate.  Respiratory: Effort normal. No respiratory distress.  GI: Soft. She exhibits no distension. There is abdominal tenderness in the suprapubic area.  Genitourinary:    Genitourinary Comments: VE: closed/thick   Musculoskeletal:        General: Normal range of motion.     Cervical back: Normal range of motion.  Neurological: She is alert and oriented to person, place, and time.  Skin: Skin is warm and dry.  Psychiatric: She has a normal mood and affect.  EFM: 130 bpm, mod variability, + accels, no decels Toco: rare  Results for orders placed or performed during the hospital encounter of 12/29/19 (from the past 24 hour(s))  Urinalysis, Routine w reflex microscopic     Status: Abnormal    Collection Time: 12/29/19  1:43 AM  Result Value Ref Range   Color, Urine STRAW (A) YELLOW   APPearance HAZY (A) CLEAR   Specific Gravity, Urine 1.008 1.005 - 1.030   pH 7.0 5.0 - 8.0   Glucose, UA NEGATIVE NEGATIVE mg/dL   Hgb urine dipstick NEGATIVE NEGATIVE   Bilirubin Urine NEGATIVE NEGATIVE   Ketones, ur NEGATIVE NEGATIVE mg/dL   Protein, ur NEGATIVE NEGATIVE mg/dL   Nitrite NEGATIVE NEGATIVE   Leukocytes,Ua NEGATIVE NEGATIVE  Wet prep, genital     Status: Abnormal   Collection Time: 12/29/19  1:53 AM   Specimen: PATH Cytology Cervicovaginal Ancillary Only  Result Value Ref Range   Yeast Wet Prep HPF POC NONE SEEN NONE SEEN   Trich, Wet Prep NONE SEEN NONE SEEN   Clue Cells Wet Prep HPF POC PRESENT (A) NONE SEEN   WBC, Wet Prep HPF POC MODERATE (A) NONE SEEN   Sperm NONE SEEN    MAU Course  Procedures Meds ordered this encounter  Medications  . acetaminophen (TYLENOL) tablet 1,000 mg  . cyclobenzaprine (FLEXERIL) tablet 10 mg   MDM Labs ordered and reviewed. No evidence of UTI or PTL. Pt resting comfortably when I entered the room but reports no improvement in pain after meds and feeling ctx q20 min. Cervix unchanged, rare ctx on toco. Discussed comfort measures for BH ctx and fetal engagement which is likely causing pain. Stable for discharge home.  Assessment and Plan   1. [redacted] weeks gestation of pregnancy   2. Encounter for supervision of low-risk pregnancy, antepartum   3. NST (non-stress test) reactive   4. Braxton Hicks contractions   5. Lightening of fetus during pregnancy in third trimester    Discharge home Follow up at [facility] as scheduled PTL precautions  Allergies as of 12/29/2019   No Known Allergies     Medication List    TAKE these medications   Blood Pressure Kit Devi 1 Device by Does not apply route as needed.   Comfort Fit Maternity Supp Med Misc 1 Device by Does not apply route daily as needed.   PrePLUS 27-1 MG Tabs Take 1 tablet by  mouth daily.      Julianne Handler, CNM 12/29/2019, 3:18 AM

## 2019-12-29 NOTE — Progress Notes (Signed)
Pt tightened arm while moving in bed

## 2019-12-29 NOTE — Discharge Instructions (Signed)
Braxton Hicks Contractions °Contractions of the uterus can occur throughout pregnancy, but they are not always a sign that you are in labor. You may have practice contractions called Braxton Hicks contractions. These false labor contractions are sometimes confused with true labor. °What are Braxton Hicks contractions? °Braxton Hicks contractions are tightening movements that occur in the muscles of the uterus before labor. Unlike true labor contractions, these contractions do not result in opening (dilation) and thinning of the cervix. Toward the end of pregnancy (32-34 weeks), Braxton Hicks contractions can happen more often and may become stronger. These contractions are sometimes difficult to tell apart from true labor because they can be very uncomfortable. You should not feel embarrassed if you go to the hospital with false labor. °Sometimes, the only way to tell if you are in true labor is for your health care provider to look for changes in the cervix. The health care provider will do a physical exam and may monitor your contractions. If you are not in true labor, the exam should show that your cervix is not dilating and your water has not broken. °If there are no other health problems associated with your pregnancy, it is completely safe for you to be sent home with false labor. You may continue to have Braxton Hicks contractions until you go into true labor. °How to tell the difference between true labor and false labor °True labor °· Contractions last 30-70 seconds. °· Contractions become very regular. °· Discomfort is usually felt in the top of the uterus, and it spreads to the lower abdomen and low back. °· Contractions do not go away with walking. °· Contractions usually become more intense and increase in frequency. °· The cervix dilates and gets thinner. °False labor °· Contractions are usually shorter and not as strong as true labor contractions. °· Contractions are usually irregular. °· Contractions  are often felt in the front of the lower abdomen and in the groin. °· Contractions may go away when you walk around or change positions while lying down. °· Contractions get weaker and are shorter-lasting as time goes on. °· The cervix usually does not dilate or become thin. °Follow these instructions at home: ° °· Take over-the-counter and prescription medicines only as told by your health care provider. °· Keep up with your usual exercises and follow other instructions from your health care provider. °· Eat and drink lightly if you think you are going into labor. °· If Braxton Hicks contractions are making you uncomfortable: °? Change your position from lying down or resting to walking, or change from walking to resting. °? Sit and rest in a tub of warm water. °? Drink enough fluid to keep your urine pale yellow. Dehydration may cause these contractions. °? Do slow and deep breathing several times an hour. °· Keep all follow-up prenatal visits as told by your health care provider. This is important. °Contact a health care provider if: °· You have a fever. °· You have continuous pain in your abdomen. °Get help right away if: °· Your contractions become stronger, more regular, and closer together. °· You have fluid leaking or gushing from your vagina. °· You pass blood-tinged mucus (bloody show). °· You have bleeding from your vagina. °· You have low back pain that you never had before. °· You feel your baby’s head pushing down and causing pelvic pressure. °· Your baby is not moving inside you as much as it used to. °Summary °· Contractions that occur before labor are   called Braxton Hicks contractions, false labor, or practice contractions. °· Braxton Hicks contractions are usually shorter, weaker, farther apart, and less regular than true labor contractions. True labor contractions usually become progressively stronger and regular, and they become more frequent. °· Manage discomfort from Braxton Hicks contractions  by changing position, resting in a warm bath, drinking plenty of water, or practicing deep breathing. °This information is not intended to replace advice given to you by your health care provider. Make sure you discuss any questions you have with your health care provider. °Document Revised: 08/17/2017 Document Reviewed: 01/18/2017 °Elsevier Patient Education © 2020 [employer] Inc[employer] ° °

## 2020-01-06 ENCOUNTER — Other Ambulatory Visit: Payer: Self-pay

## 2020-01-06 ENCOUNTER — Ambulatory Visit (INDEPENDENT_AMBULATORY_CARE_PROVIDER_SITE_OTHER): Payer: Medicaid Other | Admitting: Advanced Practice Midwife

## 2020-01-06 ENCOUNTER — Encounter: Payer: Self-pay | Admitting: Advanced Practice Midwife

## 2020-01-06 ENCOUNTER — Other Ambulatory Visit (HOSPITAL_COMMUNITY)
Admission: RE | Admit: 2020-01-06 | Discharge: 2020-01-06 | Disposition: A | Discharge status: Home / Self Care | Payer: Medicaid Other | Source: Ambulatory Visit | Attending: Advanced Practice Midwife | Admitting: Advanced Practice Midwife

## 2020-01-06 VITALS — BP 133/91 | HR 123 | Wt 184.0 lb

## 2020-01-06 DIAGNOSIS — Z3490 Encounter for supervision of normal pregnancy, unspecified, unspecified trimester: Secondary | ICD-10-CM

## 2020-01-06 DIAGNOSIS — O163 Unspecified maternal hypertension, third trimester: Secondary | ICD-10-CM

## 2020-01-06 DIAGNOSIS — Z3A36 36 weeks gestation of pregnancy: Secondary | ICD-10-CM

## 2020-01-06 NOTE — Progress Notes (Signed)
   PRENATAL VISIT NOTE  Subjective:  Lisa Cook is a 23 y.o. G3P0020 at 3w2dbeing seen today for ongoing prenatal care.  She is currently monitored for the following issues for this low-risk pregnancy and has Supervision of low-risk pregnancy and Pediatric pre-birth visit for expectant parent on their problem list.  Patient reports no complaints.  Contractions: Not present. Vag. Bleeding: None.  Movement: Present. Denies leaking of fluid.   The following portions of the patient's history were reviewed and updated as appropriate: allergies, current medications, past family history, past medical history, past social history, past surgical history and problem list.   Objective:   Vitals:   01/06/20 0822  BP: (!) 133/91  Pulse: (!) 123  Weight: 184 lb (83.5 kg)    Fetal Status: Fetal Heart Rate (bpm): 148 Fundal Height: 37 cm Movement: Present     General:  Alert, oriented and cooperative. Patient is in no acute distress.  Skin: Skin is warm and dry. No rash noted.   Cardiovascular: Normal heart rate noted  Respiratory: Normal respiratory effort, no problems with respiration noted  Abdomen: Soft, gravid, appropriate for gestational age.  Pain/Pressure: Present     Pelvic: Cervical exam performed in the presence of a chaperone Dilation: Closed Effacement (%): Thick Station: -3  Extremities: Normal range of motion.  Edema: None  Mental Status: Normal mood and affect. Normal behavior. Normal judgment and thought content.   Assessment and Plan:  Pregnancy: [id number] 333w2d. Encounter for supervision of low-risk pregnancy, antepartum - Culture, beta strep (group b only) - Cervicovaginal ancillary only( Flowella)  2. Elevated blood pressure affecting pregnancy in third trimester, antepartum - Patient denies any HA, visual disturbance or RUQ pain today. Reviewed babyscripts BPs and on 4/18 and 4/19 patient was having elevated blood pressures 140-150s/90s. - CBC - Comp Met  (CMET) - Protein / creatinine ratio, urine - Will have patient FU on 01/09/20 in the office for BP check. May need 37 week IOL depending on BP and labs  - Reviewed pre-eclampsia warning signs and advised patient to come to the hospital if she develops HA, visual disturbances, RUQ pain.   Term labor symptoms and general obstetric precautions including but not limited to vaginal bleeding, contractions, leaking of fluid and fetal movement were reviewed in detail with the patient. Please refer to After Visit Summary for other counseling recommendations.   Return in about 3 days (around 01/09/2020).  No future appointments.  HeMarcille BuffyNP, CNM  01/06/20  8:55 AM

## 2020-01-06 NOTE — Progress Notes (Signed)
Baby scripts called for her bp reading of 141/81 patient is in office today feeling fine.

## 2020-01-07 ENCOUNTER — Telehealth: Payer: Self-pay

## 2020-01-07 ENCOUNTER — Inpatient Hospital Stay (HOSPITAL_COMMUNITY)
Admission: AD | Admit: 2020-01-07 | Discharge: 2020-01-11 | DRG: 807 | Disposition: A | Discharge status: Home / Self Care | Payer: Medicaid Other | Attending: Obstetrics and Gynecology | Admitting: Obstetrics and Gynecology

## 2020-01-07 ENCOUNTER — Other Ambulatory Visit: Payer: Self-pay

## 2020-01-07 ENCOUNTER — Encounter (HOSPITAL_COMMUNITY): Payer: Self-pay | Admitting: Obstetrics & Gynecology

## 2020-01-07 ENCOUNTER — Encounter: Payer: Self-pay | Admitting: Advanced Practice Midwife

## 2020-01-07 DIAGNOSIS — Z3490 Encounter for supervision of normal pregnancy, unspecified, unspecified trimester: Secondary | ICD-10-CM | POA: Diagnosis present

## 2020-01-07 DIAGNOSIS — O1410 Severe pre-eclampsia, unspecified trimester: Secondary | ICD-10-CM | POA: Diagnosis present

## 2020-01-07 DIAGNOSIS — Z20822 Contact with and (suspected) exposure to covid-19: Secondary | ICD-10-CM | POA: Diagnosis present

## 2020-01-07 DIAGNOSIS — Z3A36 36 weeks gestation of pregnancy: Secondary | ICD-10-CM

## 2020-01-07 DIAGNOSIS — O1414 Severe pre-eclampsia complicating childbirth: Principal | ICD-10-CM | POA: Diagnosis present

## 2020-01-07 LAB — COMPREHENSIVE METABOLIC PANEL
ALT: 10 IU/L (ref 0–32)
AST: 18 IU/L (ref 0–40)
Albumin/Globulin Ratio: 1.5 (ref 1.2–2.2)
Albumin: 3.4 g/dL — ABNORMAL LOW (ref 3.9–5.0)
Alkaline Phosphatase: 205 IU/L — ABNORMAL HIGH (ref 39–117)
BUN/Creatinine Ratio: 9 (ref 9–23)
BUN: 5 mg/dL — ABNORMAL LOW (ref 6–20)
Bilirubin Total: <0.2 mg/dL (ref 0.0–1.2)
CO2: 19 mmol/L — ABNORMAL LOW (ref 20–29)
Calcium: 9 mg/dL (ref 8.7–10.2)
Chloride: 105 mmol/L (ref 96–106)
Creatinine, Ser: 0.58 mg/dL (ref 0.57–1.00)
GFR calc Af Amer: 151 mL/min/{1.73_m2} (ref >59)
GFR calc non Af Amer: 131 mL/min/{1.73_m2} (ref >59)
Globulin, Total: 2.3 g/dL (ref 1.5–4.5)
Glucose: 89 mg/dL (ref 65–99)
Potassium: 4 mmol/L (ref 3.5–5.2)
Sodium: 139 mmol/L (ref 134–144)
Total Protein: 5.7 g/dL — ABNORMAL LOW (ref 6.0–8.5)

## 2020-01-07 LAB — CBC
Hematocrit: 34.5 % (ref 34.0–46.6)
Hemoglobin: 11.8 g/dL (ref 11.1–15.9)
MCH: 30.8 pg (ref 26.6–33.0)
MCHC: 34.2 g/dL (ref 31.5–35.7)
MCV: 90 fL (ref 79–97)
Platelets: 272 10*3/uL (ref 150–450)
RBC: 3.83 x10E6/uL (ref 3.77–5.28)
RDW: 12.3 % (ref 11.7–15.4)
WBC: 8.1 10*3/uL (ref 3.4–10.8)

## 2020-01-07 LAB — URINALYSIS, ROUTINE W REFLEX MICROSCOPIC
Bilirubin Urine: NEGATIVE
Glucose, UA: NEGATIVE mg/dL
Hgb urine dipstick: NEGATIVE
Ketones, ur: NEGATIVE mg/dL
Nitrite: NEGATIVE
Protein, ur: NEGATIVE mg/dL
Specific Gravity, Urine: 1.009 (ref 1.005–1.030)
pH: 7 (ref 5.0–8.0)

## 2020-01-07 LAB — PROTEIN / CREATININE RATIO, URINE
Creatinine, Urine: 68.3 mg/dL
Protein, Ur: 12.8 mg/dL
Protein/Creat Ratio: 187 mg/g creat (ref 0–200)

## 2020-01-07 LAB — CERVICOVAGINAL ANCILLARY ONLY
Chlamydia: NEGATIVE
Comment: NEGATIVE
Comment: NORMAL
Neisseria Gonorrhea: NEGATIVE

## 2020-01-07 MED ORDER — ACETAMINOPHEN 325 MG PO TABS
650.0000 mg | ORAL_TABLET | Freq: Once | ORAL | Status: AC
  Administered 2020-01-07: 650 mg via ORAL
  Filled 2020-01-07: qty 2

## 2020-01-07 NOTE — H&P (Addendum)
OBSTETRIC ADMISSION HISTORY AND PHYSICAL  Lisa Cook is a 24 y.o. female G62P0 with IUP at [redacted]w[redacted]d by LMP presenting for IOL with gestational hypertension and new onset headache concerning for pre-eclampsia. She reports +FMs, No LOF, no VB, no blurry vision or peripheral edema, and RUQ pain.  She plans on breast feeding. She request  IUD or Nexplanon for birth control. She received her prenatal care at [facility].   Dating: By LMP --->  Estimated Date of Delivery: 02/01/20  Sono:    @[redacted]w[redacted]d , CWD, normal anatomy, cephalic presentation, EFW: 613   gm (1 lb 6 oz)     52%lie       Nursing Staff Provider  Office Location  [facility] Dating   LMP  Language   English Anatomy US   normal   Flu Vaccine  07/15/2019 Genetic Screen  NIPS: low risk   AFP:  negative  TDaP vaccine   11/11/2019 Hgb A1C or  GTT Early 5.0 Third trimester   Ref. Range 11/11/2019 08:30  Glucose, 1 hour Latest Ref Range: 65 - 179 mg/dL 142  Glucose, Fasting Latest Ref Range: 65 - 91 mg/dL 80  Glucose, 2 hour Latest Ref Range: 65 - 152 mg/dL 91    Rhogam   NA   LAB RESULTS   Feeding Plan  Breast Blood Type O/Positive/-- (10/27 1222)   Contraception  Nexplanon vs   IUD Antibody Negative (10/27 1222)   Circumcision  Yes, inpatient Rubella 5.50 (10/27 1222)   Pediatrician   List given RPR Non Reactive (02/23 0830)   Support Person  Jamie (FOB) HBsAg Negative (10/27 1222)   Prenatal Classes  Info given HIV Non Reactive (02/23 0830)  BTL Consent  N/A GBS  (For PCN allergy, check sensitivities)   VBAC Consent  N/A Pap  07/15/19 - normal    Hgb Electro   negative horizon  BP Cuff  ordered 07/08/19 CF  negative horizon    SMA  negative horizon    Waterbirth  [ ]  Class [ ]  Consent [ ]  CNM visit    Prenatal History/Complications: Gestational hypertension      Past Medical History: Past Medical History:  Diagnosis Date  . Decreased appetite 11/29/2012  . Inguinal hernia 11/2012   right  . Medical history  non-contributory     Past Surgical History: Past Surgical History:  Procedure Laterality Date  . INGUINAL HERNIA REPAIR Right 12/05/2012   Procedure: RIGHT INGUINAL HERNIA REPAIR WITH LAP LOOK ON LEFT SIDE FOR POSSIBLE REPAIR;  Surgeon: Jerilynn Mages. Gerald Stabs, MD;  Location: [facility];  Service: Pediatrics;  Laterality: Right;  Rigth inguinal hernia repair with laparoscopic look on left (no hernia)    Obstetrical History: OB History    Gravida  3   Para      Term      Preterm      AB  2   Living        SAB  1   TAB  1   Ectopic      Multiple      Live Births              Social History Social History   Socioeconomic History  . Marital status: Single    Spouse name: Not on file  . Number of children: Not on file  . Years of education: Not on file  . Highest education level: Not on file  Occupational History  . Not on file  Tobacco Use  . Smoking status: Never Smoker  . Smokeless tobacco: Never Used  Substance and Sexual Activity  . Alcohol use: Not Currently    Comment: occasionally  . Drug use: Yes    Types: Marijuana    Comment: trying to stop- currently once daily  . Sexual activity: Yes    Birth control/protection: None  Other Topics Concern  . Not on file  Social History Narrative  . Not on file   Social Determinants of Health   Financial Resource Strain:   . Difficulty of Paying Living Expenses:   Food Insecurity: No Food Insecurity  . Worried About Programme researcher, broadcasting/film/video in the Last Year: Never true  . Ran Out of Food in the Last Year: Never true  Transportation Needs: No Transportation Needs  . Lack of Transportation (Medical): No  . Lack of Transportation (Non-Medical): No  Physical Activity:   . Days of Exercise per Week:   . Minutes of Exercise per Session:   Stress:   . Feeling of Stress :   Social Connections:   . Frequency of Communication with Friends and Family:   . Frequency of Social Gatherings with Friends  and Family:   . Attends Religious Services:   . Active Member of Clubs or Organizations:   . Attends Banker Meetings:   Marland Kitchen Marital Status:     Family History: Family History  Problem Relation Age of Onset  . Diabetes Maternal Aunt   . Diabetes Maternal Grandmother   . Hypertension Maternal Grandmother   . Heart disease Maternal Grandmother   . Kidney disease Maternal Grandmother        renal failure  . Asthma Maternal Grandmother   . Asthma Brother   . Sickle cell trait Brother   . Sickle cell trait Sister     Allergies: No Known Allergies  No medications prior to admission.     Review of Systems   All systems reviewed and negative except as stated in HPI  Blood pressure (!) 138/92, pulse 97, temperature 98.8 F (37.1 C), resp. rate 19, weight 83.6 kg, SpO2 100 %. General appearance: alert and no distress Lungs: clear to auscultation bilaterally Heart: regular rate and rhythm Abdomen: soft, non-tender; bowel sounds normal Pelvic: gravid uterus  Extremities: Homans sign is negative, no sign of DVT Presentation: cephalic, confirmed by US  Fetal monitoringBaseline: 145 bpm, Variability: Good {> 6 bpm), Accelerations: Reactive and Decelerations: Absent Uterine activity: irregular     Prenatal labs: ABO, Rh: O/Positive/-- (10/27 1222) Antibody: Negative (10/27 1222) Rubella: 5.50 (10/27 1222) RPR: Non Reactive (02/23 0830)  HBsAg: Negative (10/27 1222)  HIV: Non Reactive (02/23 0830)  GBS:   unknown 2 hr Glucola (80, 142, 91)  Genetic screening  normal Anatomy US normal   Prenatal Transfer Tool  Maternal Diabetes: No Genetic Screening: Normal Maternal Ultrasounds/Referrals: Normal Fetal Ultrasounds or other Referrals:  None Maternal Substance Abuse:  No Significant Maternal Medications:  None Significant Maternal Lab Results: GBS unknown   Results for orders placed or performed during the hospital encounter of 01/07/20 (from the past 24  hour(s))  Urinalysis, Routine w reflex microscopic   Collection Time: 01/07/20  9:49 PM  Result Value Ref Range   Color, Urine YELLOW YELLOW   APPearance HAZY (A) CLEAR   Specific Gravity, Urine 1.009 1.005 - 1.030   pH 7.0 5.0 - 8.0   Glucose, UA NEGATIVE NEGATIVE mg/dL   Hgb urine dipstick NEGATIVE NEGATIVE   Bilirubin  Urine NEGATIVE NEGATIVE   Ketones, ur NEGATIVE NEGATIVE mg/dL   Protein, ur NEGATIVE NEGATIVE mg/dL   Nitrite NEGATIVE NEGATIVE   Leukocytes,Ua TRACE (A) NEGATIVE   RBC / HPF 0-5 0 - 5 RBC/hpf   WBC, UA 0-5 0 - 5 WBC/hpf   Bacteria, UA MANY (A) NONE SEEN   Squamous Epithelial / LPF 21-50 0 - 5   Mucus PRESENT     There are no problems to display for this patient.   Assessment/Plan:  Lisa Cook is a 24 y.o. G1P0 at [redacted]w[redacted]d here for IOL for pre-E.   #Labor: Admit to L&D. Discussed risks and benefits of labor induction.  Patient agreeable to Cytotec. Reassess in 3-4 hours.  #Pain: Per patient request  #FWB:  Cat 1  #ID:  GBS unknown, PCN after foley bulb. Pt had GBS collected in clinic but has not resulted yet.  #MOF: breast  #MOC: IUD vs Nexplanon  #Circ:  yes  #gHTN / Pre-eclampsia:  Patient with elevated BP's. Max SBP 168 / 99 while in the MAU.  Most recently 136/99. Patient had normal Pre-E labs in clinic on 01/06/20.  Will obtain new Pre-E labs. Fioricet ordered.   #preterm labor: BMZ ordered.    Katha Cabal, DO  01/07/2020, 11:06 PM  CNM attestation:  I have seen and examined this patient; I agree with above documentation in the resident's note.   Lisa Cook is a 24 y.o. G1P0 here for IOL due to pre-e with severe features. She has new-onset H/A, although she has been having some elevated BPs x 1-2wks. Denies visual disturbances or RUQ pain. BPs aren't in severe range.  PE: BP 137/87   Pulse 97   Temp 99.1 F (37.3 C) (Oral)   Resp 19   Ht 5\' 5"  (1.651 m)   Wt 83.6 kg   SpO2 98%   BMI 30.67 kg/m  Gen: calm  comfortable, NAD Resp: normal effort, no distress Abd: gravid  ROS, labs, PMH reviewed  Plan: -Admit to Labor and Delivery -Plan cx ripening with cytotec to start and then cervical foley when able; -Pit/AROM prn -Mag sulfate for seizure ppx -PCN with the start of Pit or ROM for unknown GBS and preterm -BMZ x 2 doses q 24h -Pre eclampsia labs still pending (were neg on 01/06/20)  01/08/20 CNM 01/08/2020, 2:28 AM

## 2020-01-07 NOTE — Telephone Encounter (Signed)
Advised that her pre-e Labs were normal and that she stills need to come in friday for a bp check patient verbalized understanding and ended the call.

## 2020-01-07 NOTE — MAU Provider Note (Signed)
Chief Complaint:  Hypertension   First Provider Initiated Contact with Patient 01/07/20 2154     HPI: Lisa Cook is a 24 y.o. G1P0 at 16w3dwho presents to maternity admissions reporting elevated blood pressures, feeling woozy and headache.  Was seen yesterday and labs done (normal).  Plan was for IOL due to Gestational Hypertension. She reports good fetal movement, denies LOF, vaginal bleeding, vaginal itching/burning, urinary symptoms, dizziness, n/v, diarrhea, constipation or fever/chills.  She denies visual changes or RUQ abdominal pain.  Hypertension This is a recurrent problem. The current episode started yesterday. The problem is unchanged. Associated symptoms include headaches. Pertinent negatives include no anxiety, blurred vision, malaise/fatigue, peripheral edema or shortness of breath. There are no associated agents to hypertension. There are no known risk factors for coronary artery disease. Past treatments include nothing. There are no compliance problems.     RN Note: Patient reports elevated Bps 164/100 at home.  Also reports feeling "woozy."  Reports a mild headache before she got here-has not taken anything for the pain.  Denies VB/CTX/LOF.  + FM.  Past Medical History: No past medical history on file.  Past obstetric history: OB History  Gravida Para Term Preterm AB Living  1            SAB TAB Ectopic Multiple Live Births               # Outcome Date GA Lbr Len/2nd Weight Sex Delivery Anes PTL Lv  1 Current             Past Surgical History:   Family History: No family history on file.  Social History: Social History   Tobacco Use  . Smoking status: Not on file  Substance Use Topics  . Alcohol use: Not on file  . Drug use: Not on file    Allergies: No Known Allergies  Meds:  No medications prior to admission.    I have reviewed patient's Past Medical Hx, Surgical Hx, Family Hx, Social Hx, medications and allergies.   ROS:  Review of  Systems  Constitutional: Negative for malaise/fatigue.  Eyes: Negative for blurred vision.  Respiratory: Negative for shortness of breath.   Neurological: Positive for headaches.   Other systems negative  Physical Exam   Patient Vitals for the past 24 hrs:  BP Temp Pulse Resp Weight  01/07/20 2147 (!) 152/99 98.8 F (37.1 C) (!) 111 19 83.6 kg   Vitals:   01/07/20 2147 01/07/20 2158 01/07/20 2216 01/07/20 2230  BP: (!) 152/99 (!) 168/91 (!) 149/99 (!) 138/92  Pulse: (!) 111 87 90 97  Resp: 19     Temp: 98.8 F (37.1 C)     SpO2:    100%  Weight: 83.6 kg       Constitutional: Well-developed, well-nourished female in no acute distress.  Cardiovascular: normal rate and rhythm Respiratory: normal effort, clear to auscultation bilaterally GI: Abd soft, non-tender, gravid appropriate for gestational age.   No rebound or guarding. MS: Extremities nontender, no edema, normal ROM Neurologic: Alert and oriented x 4.  GU: Neg CVAT.  PELVIC EXAM: deferred  Was closed and long yesterday  FHT:  Baseline 140 , moderate variability, accelerations present, no decelerations Contractions:   Rare   Labs: Results for orders placed or performed during the hospital encounter of 01/07/20 (from the past 72 hour(s))  Urinalysis, Routine w reflex microscopic     Status: Abnormal   Collection Time: 01/07/20  9:49 PM  Result Value  Ref Range   Color, Urine YELLOW YELLOW   APPearance HAZY (A) CLEAR   Specific Gravity, Urine 1.009 1.005 - 1.030   pH 7.0 5.0 - 8.0   Glucose, UA NEGATIVE NEGATIVE mg/dL   Hgb urine dipstick NEGATIVE NEGATIVE   Bilirubin Urine NEGATIVE NEGATIVE   Ketones, ur NEGATIVE NEGATIVE mg/dL   Protein, ur NEGATIVE NEGATIVE mg/dL   Nitrite NEGATIVE NEGATIVE   Leukocytes,Ua TRACE (A) NEGATIVE   RBC / HPF 0-5 0 - 5 RBC/hpf   WBC, UA 0-5 0 - 5 WBC/hpf   Bacteria, UA MANY (A) NONE SEEN   Squamous Epithelial / LPF 21-50 0 - 5   Mucus PRESENT     Comment: Performed at [facility], [address] [place], [place] [address]    Imaging:  No results found.  MAU Course/MDM: I have reviewed labs and reviewed results.  NST reviewed, reassuring Consult Dr Despina Hidden with presentation, exam findings and test results. He recommends treat with Tylenol and if this does not improve headache, will induce labor  Assessment: Single IUP  At [redacted]w[redacted]d Gestational hypertension, now with preeclampsia with severe features  Plan: Admit  Routine orders Magnesium Sulfate infusion IOL   Wynelle Bourgeois CNM, MSN Certified Nurse-Midwife 01/07/2020 9:54 PM

## 2020-01-07 NOTE — Telephone Encounter (Signed)
-----   Message from Armando Reichert, CNM sent at 01/07/2020 11:49 AM EDT ----- Patient's pre-eclampsia labs are normal. She has a blood pressure check on Friday 01/09/2020. Please be sure to review with the provider in the office when you see her. Thanks.

## 2020-01-07 NOTE — MAU Note (Signed)
Patient reports elevated Bps 164/100 at home.  Also reports feeling "woozy."  Reports a mild headache before she got here-has not taken anything for the pain.  Denies VB/CTX/LOF.  + FM.

## 2020-01-08 ENCOUNTER — Inpatient Hospital Stay (HOSPITAL_COMMUNITY): Payer: Medicaid Other | Admitting: Anesthesiology

## 2020-01-08 ENCOUNTER — Encounter (HOSPITAL_COMMUNITY): Payer: Self-pay | Admitting: Obstetrics & Gynecology

## 2020-01-08 DIAGNOSIS — O1494 Unspecified pre-eclampsia, complicating childbirth: Secondary | ICD-10-CM | POA: Diagnosis not present

## 2020-01-08 DIAGNOSIS — Z3490 Encounter for supervision of normal pregnancy, unspecified, unspecified trimester: Secondary | ICD-10-CM | POA: Diagnosis present

## 2020-01-08 DIAGNOSIS — O1414 Severe pre-eclampsia complicating childbirth: Secondary | ICD-10-CM | POA: Diagnosis not present

## 2020-01-08 DIAGNOSIS — O1410 Severe pre-eclampsia, unspecified trimester: Secondary | ICD-10-CM | POA: Diagnosis present

## 2020-01-08 DIAGNOSIS — O134 Gestational [pregnancy-induced] hypertension without significant proteinuria, complicating childbirth: Secondary | ICD-10-CM | POA: Diagnosis present

## 2020-01-08 DIAGNOSIS — Z3A36 36 weeks gestation of pregnancy: Secondary | ICD-10-CM | POA: Diagnosis not present

## 2020-01-08 DIAGNOSIS — Z20822 Contact with and (suspected) exposure to covid-19: Secondary | ICD-10-CM | POA: Diagnosis present

## 2020-01-08 LAB — CBC WITH DIFFERENTIAL/PLATELET
Abs Immature Granulocytes: 0.09 10*3/uL — ABNORMAL HIGH (ref 0.00–0.07)
Basophils Absolute: 0 10*3/uL (ref 0.0–0.1)
Basophils Relative: 0 %
Eosinophils Absolute: 0.1 10*3/uL (ref 0.0–0.5)
Eosinophils Relative: 1 %
HCT: 33.4 % — ABNORMAL LOW (ref 36.0–46.0)
Hemoglobin: 11.4 g/dL — ABNORMAL LOW (ref 12.0–15.0)
Immature Granulocytes: 1 %
Lymphocytes Relative: 27 %
Lymphs Abs: 2.6 10*3/uL (ref 0.7–4.0)
MCH: 30.7 pg (ref 26.0–34.0)
MCHC: 34.1 g/dL (ref 30.0–36.0)
MCV: 90 fL (ref 80.0–100.0)
Monocytes Absolute: 0.9 10*3/uL (ref 0.1–1.0)
Monocytes Relative: 10 %
Neutro Abs: 5.7 10*3/uL (ref 1.7–7.7)
Neutrophils Relative %: 61 %
Platelets: 269 10*3/uL (ref 150–400)
RBC: 3.71 MIL/uL — ABNORMAL LOW (ref 3.87–5.11)
RDW: 13 % (ref 11.5–15.5)
WBC: 9.4 10*3/uL (ref 4.0–10.5)
nRBC: 0 % (ref 0.0–0.2)

## 2020-01-08 LAB — CBC
HCT: 39.4 % (ref 36.0–46.0)
Hemoglobin: 12.8 g/dL (ref 12.0–15.0)
MCH: 30.4 pg (ref 26.0–34.0)
MCHC: 32.5 g/dL (ref 30.0–36.0)
MCV: 93.6 fL (ref 80.0–100.0)
Platelets: 302 10*3/uL (ref 150–400)
RBC: 4.21 MIL/uL (ref 3.87–5.11)
RDW: 13.1 % (ref 11.5–15.5)
WBC: 14.5 10*3/uL — ABNORMAL HIGH (ref 4.0–10.5)
nRBC: 0 % (ref 0.0–0.2)

## 2020-01-08 LAB — COMPREHENSIVE METABOLIC PANEL
ALT: 13 U/L (ref 0–44)
ALT: 14 U/L (ref 0–44)
AST: 19 U/L (ref 15–41)
AST: 19 U/L (ref 15–41)
Albumin: 2.7 g/dL — ABNORMAL LOW (ref 3.5–5.0)
Albumin: 3 g/dL — ABNORMAL LOW (ref 3.5–5.0)
Alkaline Phosphatase: 165 U/L — ABNORMAL HIGH (ref 38–126)
Alkaline Phosphatase: 164 U/L — ABNORMAL HIGH (ref 38–126)
Anion gap: 8 (ref 5–15)
Anion gap: 11 (ref 5–15)
BUN: <5 mg/dL — ABNORMAL LOW (ref 6–20)
BUN: <5 mg/dL — ABNORMAL LOW (ref 6–20)
CO2: 21 mmol/L — ABNORMAL LOW (ref 22–32)
CO2: 16 mmol/L — ABNORMAL LOW (ref 22–32)
Calcium: 8.6 mg/dL — ABNORMAL LOW (ref 8.9–10.3)
Calcium: 8.2 mg/dL — ABNORMAL LOW (ref 8.9–10.3)
Chloride: 107 mmol/L (ref 98–111)
Chloride: 108 mmol/L (ref 98–111)
Creatinine, Ser: 0.57 mg/dL (ref 0.44–1.00)
Creatinine, Ser: 0.56 mg/dL (ref 0.44–1.00)
GFR calc Af Amer: >60 mL/min (ref >60)
GFR calc Af Amer: >60 mL/min (ref >60)
GFR calc non Af Amer: >60 mL/min (ref >60)
GFR calc non Af Amer: >60 mL/min (ref >60)
Glucose, Bld: 89 mg/dL (ref 70–99)
Glucose, Bld: 102 mg/dL — ABNORMAL HIGH (ref 70–99)
Potassium: 3.3 mmol/L — ABNORMAL LOW (ref 3.5–5.1)
Potassium: 4 mmol/L (ref 3.5–5.1)
Sodium: 136 mmol/L (ref 135–145)
Sodium: 135 mmol/L (ref 135–145)
Total Bilirubin: 0.4 mg/dL (ref 0.3–1.2)
Total Bilirubin: 0.9 mg/dL (ref 0.3–1.2)
Total Protein: 5.5 g/dL — ABNORMAL LOW (ref 6.5–8.1)
Total Protein: 6.4 g/dL — ABNORMAL LOW (ref 6.5–8.1)

## 2020-01-08 LAB — PROTEIN / CREATININE RATIO, URINE
Creatinine, Urine: 62.99 mg/dL
Protein Creatinine Ratio: 0.13 mg/mg{Cre} (ref 0.00–0.15)
Total Protein, Urine: 8 mg/dL

## 2020-01-08 LAB — RESPIRATORY PANEL BY RT PCR (FLU A&B, COVID)
Influenza A by PCR: NEGATIVE
Influenza B by PCR: NEGATIVE
SARS Coronavirus 2 by RT PCR: NEGATIVE

## 2020-01-08 LAB — TYPE AND SCREEN
ABO/RH(D): O POS
Antibody Screen: NEGATIVE

## 2020-01-08 LAB — ABO/RH: ABO/RH(D): O POS

## 2020-01-08 LAB — RPR: RPR Ser Ql: NONREACTIVE

## 2020-01-08 MED ORDER — OXYTOCIN 40 UNITS IN NORMAL SALINE INFUSION - SIMPLE MED
1.0000 m[IU]/min | INTRAVENOUS | Status: DC
  Administered 2020-01-08: 2 m[IU]/min via INTRAVENOUS

## 2020-01-08 MED ORDER — SOD CITRATE-CITRIC ACID 500-334 MG/5ML PO SOLN: 30.0000 mL | ORAL | Status: DC | PRN

## 2020-01-08 MED ORDER — TERBUTALINE SULFATE 1 MG/ML IJ SOLN: 0.2500 mg | Freq: Once | INTRAMUSCULAR | Status: DC | PRN

## 2020-01-08 MED ORDER — PHENYLEPHRINE 40 MCG/ML (10ML) SYRINGE FOR IV PUSH (FOR BLOOD PRESSURE SUPPORT)
80.0000 ug | PREFILLED_SYRINGE | INTRAVENOUS | Status: DC | PRN
  Administered 2020-01-08: 80 ug via INTRAVENOUS
  Filled 2020-01-08: qty 10

## 2020-01-08 MED ORDER — MISOPROSTOL 25 MCG QUARTER TABLET
ORAL_TABLET | ORAL | Status: AC
  Filled 2020-01-08: qty 1

## 2020-01-08 MED ORDER — OXYTOCIN BOLUS FROM INFUSION
500.0000 mL | Freq: Once | INTRAVENOUS | Status: AC
Start: 2020-01-08 — End: 2020-01-09
  Administered 2020-01-09: 500 mL via INTRAVENOUS

## 2020-01-08 MED ORDER — MISOPROSTOL 50MCG HALF TABLET
50.0000 ug | ORAL_TABLET | ORAL | Status: DC | PRN
  Administered 2020-01-08: 50 ug via BUCCAL
  Filled 2020-01-08: qty 1

## 2020-01-08 MED ORDER — BETAMETHASONE SOD PHOS & ACET 6 (3-3) MG/ML IJ SUSP
12.0000 mg | INTRAMUSCULAR | Status: AC
  Administered 2020-01-08 – 2020-01-09 (×2): 12 mg via INTRAMUSCULAR
  Filled 2020-01-08: qty 5

## 2020-01-08 MED ORDER — MAGNESIUM SULFATE 40 GM/1000ML IV SOLN
1.0000 g/h | INTRAVENOUS | Status: DC
  Filled 2020-01-08: qty 1000

## 2020-01-08 MED ORDER — MISOPROSTOL 25 MCG QUARTER TABLET
25.0000 ug | ORAL_TABLET | ORAL | Status: DC | PRN
  Administered 2020-01-08: 25 ug via VAGINAL
  Filled 2020-01-08: qty 1

## 2020-01-08 MED ORDER — ZOLPIDEM TARTRATE 5 MG PO TABS
5.0000 mg | ORAL_TABLET | Freq: Once | ORAL | Status: AC
  Administered 2020-01-08: 5 mg via ORAL
  Filled 2020-01-08: qty 1

## 2020-01-08 MED ORDER — PHENYLEPHRINE 40 MCG/ML (10ML) SYRINGE FOR IV PUSH (FOR BLOOD PRESSURE SUPPORT)
80.0000 ug | PREFILLED_SYRINGE | INTRAVENOUS | Status: DC | PRN
  Administered 2020-01-08: 80 ug via INTRAVENOUS

## 2020-01-08 MED ORDER — EPHEDRINE 5 MG/ML INJ: 10.0000 mg | INTRAVENOUS | Status: DC | PRN

## 2020-01-08 MED ORDER — LIDOCAINE HCL (PF) 1 % IJ SOLN: 30.0000 mL | INTRAMUSCULAR | Status: DC | PRN

## 2020-01-08 MED ORDER — DIPHENHYDRAMINE HCL 50 MG/ML IJ SOLN: 12.5000 mg | INTRAMUSCULAR | Status: DC | PRN

## 2020-01-08 MED ORDER — HYDRALAZINE HCL 50 MG PO TABS
25.0000 mg | ORAL_TABLET | Freq: Four times a day (QID) | ORAL | Status: DC | PRN
  Filled 2020-01-08: qty 1

## 2020-01-08 MED ORDER — LIDOCAINE HCL (PF) 1 % IJ SOLN
INTRAMUSCULAR | Status: DC | PRN
  Administered 2020-01-08: 11 mL via EPIDURAL

## 2020-01-08 MED ORDER — FENTANYL-BUPIVACAINE-NACL 0.5-0.125-0.9 MG/250ML-% EP SOLN
12.0000 mL/h | EPIDURAL | Status: DC | PRN
  Filled 2020-01-08: qty 250

## 2020-01-08 MED ORDER — LACTATED RINGERS IV SOLN
500.0000 mL | INTRAVENOUS | Status: DC | PRN
  Administered 2020-01-08 (×2): 500 mL via INTRAVENOUS

## 2020-01-08 MED ORDER — PENICILLIN G POT IN DEXTROSE 60000 UNIT/ML IV SOLN
3.0000 10*6.[IU] | INTRAVENOUS | Status: DC
Start: 2020-01-08 — End: 2020-01-09
  Administered 2020-01-08 (×3): 3 10*6.[IU] via INTRAVENOUS
  Filled 2020-01-08 (×3): qty 50

## 2020-01-08 MED ORDER — FENTANYL CITRATE (PF) 100 MCG/2ML IJ SOLN
50.0000 ug | INTRAMUSCULAR | Status: DC | PRN
  Administered 2020-01-08 (×3): 100 ug via INTRAVENOUS
  Filled 2020-01-08 (×3): qty 2

## 2020-01-08 MED ORDER — SODIUM CHLORIDE 0.9 % IV SOLN
5.0000 10*6.[IU] | Freq: Once | INTRAVENOUS | Status: AC
  Administered 2020-01-08: 11:00:00 5 10*6.[IU] via INTRAVENOUS
  Filled 2020-01-08: qty 5

## 2020-01-08 MED ORDER — SODIUM CHLORIDE (PF) 0.9 % IJ SOLN
INTRAMUSCULAR | Status: DC | PRN
  Administered 2020-01-08: 12 mL/h via EPIDURAL

## 2020-01-08 MED ORDER — MISOPROSTOL 50MCG HALF TABLET
50.0000 ug | ORAL_TABLET | ORAL | Status: DC | PRN
  Administered 2020-01-08: 50 ug via ORAL
  Filled 2020-01-08: qty 1

## 2020-01-08 MED ORDER — LACTATED RINGERS IV SOLN: INTRAVENOUS | Status: DC

## 2020-01-08 MED ORDER — LABETALOL HCL 5 MG/ML IV SOLN: 20.0000 mg | INTRAVENOUS | Status: DC | PRN

## 2020-01-08 MED ORDER — MAGNESIUM SULFATE BOLUS VIA INFUSION
4.0000 g | Freq: Once | INTRAVENOUS | Status: AC
  Administered 2020-01-08: 4 g via INTRAVENOUS
  Filled 2020-01-08: qty 1000

## 2020-01-08 MED ORDER — BUTALBITAL-APAP-CAFFEINE 50-325-40 MG PO TABS
1.0000 | ORAL_TABLET | Freq: Four times a day (QID) | ORAL | Status: DC | PRN
  Administered 2020-01-08: 1 via ORAL
  Filled 2020-01-08: qty 1

## 2020-01-08 MED ORDER — ONDANSETRON HCL 4 MG/2ML IJ SOLN
4.0000 mg | Freq: Four times a day (QID) | INTRAMUSCULAR | Status: DC | PRN
  Administered 2020-01-08 (×2): 4 mg via INTRAVENOUS
  Filled 2020-01-08 (×2): qty 2

## 2020-01-08 MED ORDER — LABETALOL HCL 5 MG/ML IV SOLN: 80.0000 mg | INTRAVENOUS | Status: DC | PRN

## 2020-01-08 MED ORDER — LACTATED RINGERS IV SOLN
500.0000 mL | Freq: Once | INTRAVENOUS | Status: AC
  Administered 2020-01-08: 500 mL via INTRAVENOUS

## 2020-01-08 MED ORDER — LABETALOL HCL 5 MG/ML IV SOLN: 40.0000 mg | INTRAVENOUS | Status: DC | PRN

## 2020-01-08 MED ORDER — ACETAMINOPHEN 325 MG PO TABS
650.0000 mg | ORAL_TABLET | ORAL | Status: DC | PRN
  Administered 2020-01-08: 650 mg via ORAL
  Filled 2020-01-08: qty 2

## 2020-01-08 MED ORDER — OXYTOCIN 40 UNITS IN NORMAL SALINE INFUSION - SIMPLE MED
2.5000 [IU]/h | INTRAVENOUS | Status: DC
Start: 2020-01-08 — End: 2020-01-09
  Filled 2020-01-08: qty 1000

## 2020-01-08 NOTE — Anesthesia Preprocedure Evaluation (Signed)
Anesthesia Evaluation  Patient identified by MRN, date of birth, ID band Patient awake    Reviewed: Allergy & Precautions, H&P , NPO status , Patient's Chart, lab work & pertinent test results  Airway Mallampati: I  TM Distance: >3 FB Neck ROM: full    Dental no notable dental hx.    Pulmonary    Pulmonary exam normal breath sounds clear to auscultation       Cardiovascular hypertension, Normal cardiovascular exam Rhythm:Regular Rate:Normal     Neuro/Psych    GI/Hepatic   Endo/Other    Renal/GU      Musculoskeletal   Abdominal   Peds  Hematology   Anesthesia Other Findings Preeclampsia  Reproductive/Obstetrics (+) Pregnancy                             Anesthesia Physical  Anesthesia Plan  ASA: III  Anesthesia Plan: Epidural   Post-op Pain Management:    Induction:   PONV Risk Score and Plan:   Airway Management Planned:   Additional Equipment:   Intra-op Plan:   Post-operative Plan:   Informed Consent: I have reviewed the patients History and Physical, chart, labs and discussed the procedure including the risks, benefits and alternatives for the proposed anesthesia with the patient or authorized representative who has indicated his/her understanding and acceptance.       Plan Discussed with:   Anesthesia Plan Comments:         Anesthesia Quick Evaluation

## 2020-01-08 NOTE — Progress Notes (Addendum)
Labor Progress Note Lisa Cook is a 24 y.o. G1P0 at [redacted]w[redacted]d presented for IOL for pre-E with severe features (headache).   S: Patient tired and would like to rest. States headache has gone away. Denies chest discomfort or RUQ abdominal pain.    O:  BP 120/79   Pulse 86   Temp 99.1 F (37.3 C) (Oral)   Resp 16   Ht 5\' 5"  (1.651 m)   Wt 83.6 kg   SpO2 100%   BMI 30.67 kg/m  EFM: 130 / mod variability / pos accels, no decels   CVE: Dilation: Closed Presentation: Vertex(Confirmed by bedside U/S) Exam by:: Dr. [email or web address]   A&P: 24 y.o. G1P0 [redacted]w[redacted]d here for IOL for pre-E with severe features.   #Labor: Will recheck in 2-3 hours. Cytotec given at 0215. Ambien given for patient to get some rest.  Anticipate vaginal delivery.  #Pain: per pt request  #FWB: Cat 1 #GBS unknown. Plan for PCN after Foley balloon.   #Pre-E with severe features: Pre-E labs unremarkable (Pr/Cr 0.13, Plt 269). Receiving Mg gtt. Fioricet prn for headache. Recent BP 130/93. Continue to monitor.    Lisa Mccolm, DO 5:03 AM

## 2020-01-08 NOTE — Progress Notes (Signed)
Labor Progress Note Lisa Cook is a 24 y.o. G1P0 at [redacted]w[redacted]d presented for IOL for pre-E with severe features (headache).   S:  Patient resting. Feeling some pressure. Denies headaches, changes in vision and RUQ pain.     O:  BP (!) 107/49   Pulse 90   Temp 97.7 F (36.5 C) (Oral)   Resp 17   Ht 5\' 5"  (1.651 m)   Wt 83.6 kg   LMP 04/27/2019   SpO2 98%   BMI 30.67 kg/m   EFM: 120s / moderate variability / no decels, pos accels  TOCO: every 2-3 minutes  CVE: Dilation: 6 Effacement (%): 80 Cervical Position: Posterior Station: 0, -1 Presentation: Vertex Exam by:: Dr. [email or web address]   A&P: 24 y.o. G1P0 [redacted]w[redacted]d here for IOL for pre-E with severe features.   #Labor: Progressing s/p Cytotec x3, Foley bulb and pitocin. Had SROM with Foley bulb expulsion.  #Pain: epidural #FWB: Cat 1  #GBS unknown. PCN >4 hours PTD  #Pre-E with severe features: Normotensive with lower DBP's.  Asymptomatic.  Pre-E labs unremarkable.  Mg gtt.   # preterm labor:  BMZ given x1   [redacted]w[redacted]d, DO 8:56 PM

## 2020-01-08 NOTE — Progress Notes (Signed)
Lisa Cook is a 24 y.o. G3P0020 at [redacted]w[redacted]d by LMP admitted for induction of labor due to preeclampsia w/ SF.  Subjective: Patient comfortable, asleep. Not feeling contractions.   Objective: BP (!) 147/95   Pulse 82   Temp 98.6 F (37 C)   Resp 18   Ht 5\' 5"  (1.651 m)   Wt 83.6 kg   LMP 04/27/2019   SpO2 98%   BMI 30.67 kg/m  I/O last 3 completed shifts: In: 1036.1 [P.O.:360; I.V.:676.1] Out: 1500 [Urine:1500] Total I/O In: 956.3 [P.O.:420; I.V.:536.3] Out: 1500 [Urine:1500]  FHT:  FHR: 120 bpm, variability: moderate,  accelerations:  Present,  decelerations:  Absent UC:   irregular, every 2-3+ minutes SVE:   Dilation: 1 Effacement (%): 60 Station: -3 Exam by:: Dr. [email or web address]  Labs: Lab Results  Component Value Date   WBC 9.4 01/08/2020   HGB 11.4 (L) 01/08/2020   HCT 33.4 (L) 01/08/2020   MCV 90.0 01/08/2020   PLT 269 01/08/2020    Foley bulb was placed with ease, patient tolerated procedure well.   Assessment / Plan: IOL 2/2 preeclampsia with severe features.    Labor: Progressing normally and foley bulb placed. may need to switch to pitocin, holding cytotec for now after FB placement Preeclampsia:  on magnesium sulfate, no signs or symptoms of toxicity, intake and ouput balanced and labs stable Fetal Wellbeing:  Category I Pain Control:  Labor support without medications I/D:  Unknown, giving PCN due to preterm Anticipated MOD:  NSVD  01/10/2020 , DO 01/08/2020, 11:14 AM

## 2020-01-08 NOTE — Progress Notes (Signed)
Labor Progress Note Lisa Cook is a 24 y.o. G1P0 at [redacted]w[redacted]d presented for IOL for pre-E with severe features (headache).   S: Patient sleepy s/p Ambien. Denies headache or blurry vision.  Patient not feeling CTXs.    O:  BP 119/67   Pulse (!) 112   Temp 99.1 F (37.3 C) (Oral)   Resp 16   Ht 5\' 5"  (1.651 m)   Wt 83.6 kg   SpO2 100%   BMI 30.67 kg/m   EFM: 125 / moderate variability / pos accels, no decels  TOCO: every 2-4 minutes   CVE: Dilation: Closed Effacement (%): 50 Station: -3 Presentation: Vertex Exam by:: Dr. [email or web address]   A&P: 24 y.o. G1P0 [redacted]w[redacted]d here for IOL for pre-E with severe features.   #Labor: Continue cervical ripening. Vaginal cytotec placed.  Anticipate vaginal delivery.  #Pain: per pt request  #FWB: Cat 1 #GBS unknown. Plan for PCN after Foley balloon.   #Pre-E with severe features: Pre-E labs were unremarkable (Pr/Cr 0.13, Plt 269). Receiving Mg gtt. Fioricet prn for headache. Normotensive currently. Continue to monitor.    [redacted]w[redacted]d, DO 6:44 AM

## 2020-01-08 NOTE — Anesthesia Procedure Notes (Signed)
Epidural Patient location during procedure: OB Start time: 01/08/2020 5:48 PM End time: 01/08/2020 5:57 PM  Staffing Anesthesiologist: Lowella Curb, MD Performed: anesthesiologist   Preanesthetic Checklist Completed: patient identified, IV checked, site marked, risks and benefits discussed, surgical consent, monitors and equipment checked, pre-op evaluation and timeout performed  Epidural Patient position: sitting Prep: ChloraPrep Patient monitoring: heart rate, cardiac monitor, continuous pulse ox and blood pressure Approach: midline Location: L2-L3 Injection technique: LOR saline  Needle:  Needle type: Tuohy  Needle gauge: 17 G Needle length: 9 cm Needle insertion depth: 6 cm Catheter type: closed end flexible Catheter size: 20 Guage Catheter at skin depth: 10 cm Test dose: negative  Assessment Events: blood not aspirated, injection not painful, no injection resistance, no paresthesia and negative IV test  Additional Notes Reason for block:procedure for pain

## 2020-01-08 NOTE — Progress Notes (Signed)
Labor Progress Note Lisa Cook is a 24 y.o. G1P0 at [redacted]w[redacted]d presented for IOL for pre-E with severe features (headache).   S:  Patient nauseous while sitting upright.  Given antiemetic by RN.  Denies headache or pelvic pressure currently.    O:  BP (!) 113/52   Pulse 86   Temp 97.7 F (36.5 C) (Oral)   Resp 17   Ht 5\' 5"  (1.651 m)   Wt 83.6 kg   LMP 04/27/2019   SpO2 98%   BMI 30.67 kg/m   EFM:  125 / mod variability / early decels, pos accels  TOCO: every 2-3 minutes   CVE: Dilation: 7 Effacement (%): 80 Cervical Position: Posterior Station: 0 Presentation: Vertex Exam by:: Dr. [email or web address]   A&P: 24 y.o. G1P0 [redacted]w[redacted]d here for IOL for pre-E with severe features.   #Labor: Progressing s/p Cytotec x3, Foley bulb, SROM and pitocin.  Continue expectant management. Reassess in 1-2 hours.  Anticipate vaginal delivery.  #Pain: epidural #FWB: Cat 1  #GBS unknown. PCN >4 hours PTD  #Pre-E with severe features: Normotensive to mildly hypotensive with DBPs in the 50s.  Continue LR.  Remains asymptomatic.  Pre-E labs unremarkable.  Mg gtt.   # preterm labor:  BMZ given x1. Anticipate second dose ~0200 if not delivered by then.  [redacted]w[redacted]d, DO 11:24 PM

## 2020-01-08 NOTE — Progress Notes (Signed)
Labor Progress Note Lisa Cook is a 24 y.o. G3P0020 at [redacted]w[redacted]d by LMP admitted for induction of labor due to preeclampsia w/ SF.  S: Patient more uncomfortable, lying on side. Feeling contractions.   O:  BP 137/64   Pulse 83   Temp 98.6 F (37 C) (Oral)   Resp 18   Ht 5\' 5"  (1.651 m)   Wt 83.6 kg   LMP 04/27/2019   SpO2 98%   BMI 30.67 kg/m   CVE: Dilation: 3 Effacement (%): 60 Station: -3 Presentation: Vertex Exam by:: S moyer RN   Dr. [email or web address] as chaperone. Felt cervix around foley bulb.    A&P: 24 y.o. 21 [redacted]w[redacted]d here for IOL 2/2 preeclampsia with SF.  #Labor: Progressing well. Cont foley bulb. Will start pitocin. #Preeclampsia: on Mag sulfate. Labs stable.  #Pain: Labor support w/o meds #FWB: Cat 1 #GBS unknown, giving PCN  [redacted]w[redacted]d, DO 4:46 PM

## 2020-01-09 ENCOUNTER — Ambulatory Visit: Payer: Medicaid Other

## 2020-01-09 ENCOUNTER — Encounter (HOSPITAL_COMMUNITY): Payer: Self-pay | Admitting: Obstetrics & Gynecology

## 2020-01-09 DIAGNOSIS — O1414 Severe pre-eclampsia complicating childbirth: Principal | ICD-10-CM

## 2020-01-09 DIAGNOSIS — Z3A36 36 weeks gestation of pregnancy: Secondary | ICD-10-CM

## 2020-01-09 LAB — CBC
HCT: 34.1 % — ABNORMAL LOW (ref 36.0–46.0)
Hemoglobin: 11.1 g/dL — ABNORMAL LOW (ref 12.0–15.0)
MCH: 29.6 pg (ref 26.0–34.0)
MCHC: 32.6 g/dL (ref 30.0–36.0)
MCV: 90.9 fL (ref 80.0–100.0)
Platelets: 277 10*3/uL (ref 150–400)
RBC: 3.75 MIL/uL — ABNORMAL LOW (ref 3.87–5.11)
RDW: 13.2 % (ref 11.5–15.5)
WBC: 16.6 10*3/uL — ABNORMAL HIGH (ref 4.0–10.5)
nRBC: 0 % (ref 0.0–0.2)

## 2020-01-09 MED ORDER — WITCH HAZEL-GLYCERIN EX PADS: 1.0000 "application " | MEDICATED_PAD | CUTANEOUS | Status: DC | PRN

## 2020-01-09 MED ORDER — SIMETHICONE 80 MG PO CHEW: 80.0000 mg | CHEWABLE_TABLET | ORAL | Status: DC | PRN

## 2020-01-09 MED ORDER — OXYCODONE HCL 5 MG PO TABS
10.0000 mg | ORAL_TABLET | ORAL | Status: DC | PRN
  Administered 2020-01-09: 21:00:00 10 mg via ORAL
  Filled 2020-01-09: qty 2

## 2020-01-09 MED ORDER — MAGNESIUM HYDROXIDE 400 MG/5ML PO SUSP
30.0000 mL | ORAL | Status: DC | PRN
  Administered 2020-01-10: 30 mL via ORAL
  Filled 2020-01-09: qty 30

## 2020-01-09 MED ORDER — ACETAMINOPHEN 325 MG PO TABS
650.0000 mg | ORAL_TABLET | ORAL | Status: DC | PRN
  Administered 2020-01-09: 650 mg via ORAL
  Filled 2020-01-09: qty 2

## 2020-01-09 MED ORDER — ONDANSETRON HCL 4 MG PO TABS: 4.0000 mg | ORAL_TABLET | ORAL | Status: DC | PRN

## 2020-01-09 MED ORDER — MAGNESIUM SULFATE 40 GM/1000ML IV SOLN
1.0000 g/h | INTRAVENOUS | Status: AC
  Administered 2020-01-09: 1 g/h via INTRAVENOUS
  Filled 2020-01-09: qty 1000

## 2020-01-09 MED ORDER — BENZOCAINE-MENTHOL 20-0.5 % EX AERO
1.0000 "application " | INHALATION_SPRAY | CUTANEOUS | Status: DC | PRN
  Administered 2020-01-09: 1 via TOPICAL
  Filled 2020-01-09: qty 56

## 2020-01-09 MED ORDER — IBUPROFEN 600 MG PO TABS
600.0000 mg | ORAL_TABLET | Freq: Four times a day (QID) | ORAL | Status: DC
  Administered 2020-01-09 – 2020-01-11 (×9): 600 mg via ORAL
  Filled 2020-01-09 (×9): qty 1

## 2020-01-09 MED ORDER — MISOPROSTOL 200 MCG PO TABS
ORAL_TABLET | ORAL | Status: AC
  Filled 2020-01-09: qty 2

## 2020-01-09 MED ORDER — ONDANSETRON HCL 4 MG/2ML IJ SOLN: 4.0000 mg | INTRAMUSCULAR | Status: DC | PRN

## 2020-01-09 MED ORDER — SENNOSIDES-DOCUSATE SODIUM 8.6-50 MG PO TABS
2.0000 | ORAL_TABLET | ORAL | Status: DC
  Administered 2020-01-09 – 2020-01-10 (×2): 2 via ORAL
  Filled 2020-01-09 (×2): qty 2

## 2020-01-09 MED ORDER — LACTATED RINGERS IV SOLN: INTRAVENOUS | Status: DC

## 2020-01-09 MED ORDER — MISOPROSTOL 200 MCG PO TABS
400.0000 ug | ORAL_TABLET | Freq: Once | ORAL | Status: AC
  Administered 2020-01-09: 400 ug via BUCCAL

## 2020-01-09 MED ORDER — OXYCODONE HCL 5 MG PO TABS
5.0000 mg | ORAL_TABLET | ORAL | Status: DC | PRN
  Administered 2020-01-09: 5 mg via ORAL
  Filled 2020-01-09: qty 1

## 2020-01-09 MED ORDER — COCONUT OIL OIL
1.0000 "application " | TOPICAL_OIL | Status: DC | PRN
  Administered 2020-01-09: 1 via TOPICAL

## 2020-01-09 MED ORDER — PRENATAL MULTIVITAMIN CH
1.0000 | ORAL_TABLET | Freq: Every day | ORAL | Status: DC
  Administered 2020-01-09 – 2020-01-10 (×2): 1 via ORAL
  Filled 2020-01-09 (×2): qty 1

## 2020-01-09 MED ORDER — DIBUCAINE (PERIANAL) 1 % EX OINT: 1.0000 "application " | TOPICAL_OINTMENT | CUTANEOUS | Status: DC | PRN

## 2020-01-09 MED ORDER — DIPHENHYDRAMINE HCL 25 MG PO CAPS: 25.0000 mg | ORAL_CAPSULE | Freq: Four times a day (QID) | ORAL | Status: DC | PRN

## 2020-01-09 NOTE — Progress Notes (Signed)
LABOR PROGRESS NOTE  Lisa Cook is a 24 y.o. G3P0020 at [redacted]w[redacted]d  admitted for IOL for PreE w SF (HA).  Subjective: Comfortable with epidural Tired, would like to nap  Objective: BP 116/67   Pulse 92   Temp 97.7 F (36.5 C) (Oral)   Resp 18   Ht 5\' 5"  (1.651 m)   Wt 83.6 kg   LMP 04/27/2019   SpO2 99%   BMI 30.67 kg/m  or  Vitals:   01/08/20 2330 01/08/20 2353 01/09/20 0002 01/09/20 0005  BP: (!) 101/54  116/67   Pulse: 72  92   Resp: 18 18 18    Temp:      TempSrc:      SpO2:    99%  Weight:      Height:         Dilation: 5 Effacement (%): 80 Cervical Position: Posterior Station: 0 Presentation: Vertex Exam by:: Dr. FHT: baseline rate 125, moderate varibility, +acel, early and variable decel Toco: q2 min  Labs: Lab Results  Component Value Date   WBC 14.5 (H) 01/08/2020   HGB 12.8 01/08/2020   HCT 39.4 01/08/2020   MCV 93.6 01/08/2020   PLT 302 01/08/2020    Patient Active Problem List   Diagnosis Date Noted  . Encounter for induction of labor 01/08/2020  . Pre-eclampsia, severe 01/08/2020  . Pediatric pre-birth visit for expectant parent 12/16/2019  . Supervision of low-risk pregnancy 07/08/2019    Assessment / Plan: 24 y.o. G3P0020 at [redacted]w[redacted]d here for IOL for PreE w SF (HA).  Labor: S/p miso x3, FB, SROM, and pitocin augmentation started at 1855. Still in latent stage, IUPC placed at this check to assist with tracing contractions and titrating pitocin, cont augmentation.  Fetal Wellbeing:  Cat II for variables but overall reassuring with moderate variability and accels Pain Control:  Epidural, working well GBS: unknown, on penicillin Anticipated MOD:  SVD  PreE w SF: on Mg gtt, BP's have been normal to mild range  Preterm: due for second dose of BMZ at 0130  [facility], MD/[facility] OB Fellow  01/09/2020, 1:01 AM

## 2020-01-09 NOTE — Anesthesia Postprocedure Evaluation (Signed)
Anesthesia Post Note  Patient: Lisa Cook  Procedure(s) Performed: AN AD HOC LABOR EPIDURAL     Patient location during evaluation: Mother Baby Anesthesia Type: Epidural Level of consciousness: awake Pain management: satisfactory to patient Vital Signs Assessment: post-procedure vital signs reviewed and stable Respiratory status: spontaneous breathing Cardiovascular status: stable Anesthetic complications: no    Last Vitals:  Vitals:   01/09/20 0805 01/09/20 1146  BP: (!) 138/91 (!) 145/85  Pulse: 81 77  Resp: 18 18  Temp: 36.9 C 36.9 C  SpO2: 98% 99%    Last Pain:  Vitals:   01/09/20 1146  TempSrc: Oral  PainSc:    Pain Goal: Patients Stated Pain Goal: 5 (01/09/20 0700)                 Cephus Shelling

## 2020-01-09 NOTE — Lactation Note (Signed)
This note was copied from a baby's chart. Lactation Consultation Note  Patient Name: Boy Trishia Cuthrell [id number] Date: 01/09/2020 Reason for consult: Initial assessment;Late-preterm 34-36.6wks;Primapara   P1 mom on Mag.  She has a DEBP set up and has pumped once and states nothing came out.  Baby Chase in bassinet.  Mom has hand exp. But did not collect anything.  LC asked about moms goals for feeding infant. She states she would like to BF but will formula feed if she doesn't have milk.  She does have WIC-[place]. LC reviewed LPTI guidelines and discussed supplementation after BF.  Options given were EBM, donor milk, and formula.   LC also educated mom and mat. Grandmother on LPTI behaviour.    LC discussed not going longer than 3 hours to feed infant.  LC offered to assist with feed attempt.  Mom agreed.  Hand expression reviewed and mom was able to exp. 2 drops from left.  Infant placed STS and would not wake to feed. A couple of different positions tried but infant was too sleepy to latch.  LC placed colostrum on gloved finger and infant licked then feel asleep.    On oral assessment, LC noted top labial frenulum and bifurcated gumline.   Mom has DEBP she's used once.  LC reviewed purpose and goal of pumping.  8 boxes written on board as a check off goal for pumping sessions in 24 hours.  Encouraged evedry 2-3 hours with 4 hour stretch at night but expressed to mom that any amount of pumping would be better than none and to do what she can.  Mom is still on Magnesium and is tired.   Mom denies DEBP questions.  Correct flange fit discussed.  Mom prefers to try to pump and see if she is able to collect anything. When need for supplementation comes; mom prefers formula to donor breastmilk.  This was relayed to RN.    Mom understands why supplementation after BF for LPTI is important. She knows to use her EBM first when supplementing.  Amounts reviewed and sheet left in room with family.         Maternal Data Has patient been taught Hand Expression?: Yes Does the patient have breastfeeding experience prior to this delivery?: No  Feeding Feeding Type: Breast Fed  LATCH Score Latch: Too sleepy or reluctant, no latch achieved, no sucking elicited.  Audible Swallowing: None  Type of Nipple: Everted at rest and after stimulation  Comfort (Breast/Nipple): Soft / non-tender  Hold (Positioning): Assistance needed to correctly position infant at breast and maintain latch.  LATCH Score: 5  Interventions Interventions: Breast feeding basics reviewed;Skin to skin;Assisted with latch;Breast massage;Hand express;DEBP  Lactation Tools Discussed/Used WIC Program: Yes Date initiated:: 01/09/20   Consult Status Consult Status: Follow-up Date: 01/10/20 Follow-up type: In-patient    Maryruth Hancock [facility] 01/09/2020, 12:27 PM

## 2020-01-09 NOTE — Discharge Summary (Signed)
Postpartum Discharge Summary    Patient Name: Lisa Cook DOB: [birth year] MRN: [id number]  Date of admission: 01/07/2020 Delivering Provider: Clarnce Flock   Date of discharge: 01/11/2020  Admitting diagnosis: Encounter for induction of labor [Z34.90] Intrauterine pregnancy: [redacted]w[redacted]d    Secondary diagnosis:  Active Problems:   Encounter for induction of labor   Pre-eclampsia, severe   NSVD (normal spontaneous vaginal delivery)   Shoulder dystocia, delivered      Discharge diagnosis: Preterm Pregnancy Delivered and Preeclampsia (severe)                                                                                                 Augmentation: Pitocin, Cytotec and Foley Balloon  Complications: None  Hospital course:  Induction of Labor With Vaginal Delivery   24y.o. yo G3P0020 at [id number] admitted to the hospital 01/07/2020 for induction of labor.  Indication for induction: Preeclampsia.  Patient had an uncomplicated labor course as follows: induced with misoprostol x3 and FB, had SROM at time of FB coming out and then augmented with pitocin and progressed to complete.  Membrane Rupture Time/Date: 5:01 PM ,01/08/2020   Intrapartum Procedures: Episiotomy: None [1]                                         Lacerations:  None [1]  Patient had delivery of a Viable infant.  Information for the patient's newborn:  ClEmberlie, Gotcher0[[id number]]Delivery Method: Vag-Spont    01/09/2020  Details of delivery can be found in separate delivery note, notable for shoulder dystocia lasting 32m30m.  Patient had a routine postpartum course and received magnesium sulfate prophylaxis for 24 hours post delivery. She was started on procardia. Patient is discharged home 01/11/20. Delivery time: 3:46 AM    Magnesium Sulfate received: Yes BMZ received: Yes Rhophylac:N/A MMR:N/A Transfusion:No  Physical exam  Vitals:   01/10/20 1935 01/10/20 1936 01/10/20 2314 01/11/20 0259  BP:   123/70 (!) 145/86 (!) 149/99  Pulse:  75 75 64  Resp:  18 17 16   Temp:  98.3 F (36.8 C) 98 F (36.7 C) 97.9 F (36.6 C)  TempSrc:  Oral Oral Oral  SpO2: 100% 100% 99% 100%  Weight:      Height:       General: alert, cooperative and no distress Lochia: appropriate Uterine Fundus: firm Incision: N/A DVT Evaluation: No evidence of DVT seen on physical exam. Labs: Lab Results  Component Value Date   WBC 16.6 (H) 01/09/2020   HGB 11.1 (L) 01/09/2020   HCT 34.1 (L) 01/09/2020   MCV 90.9 01/09/2020   PLT 277 01/09/2020   CMP Latest Ref Rng & Units 01/08/2020  Glucose 70 - 99 mg/dL 102(H)  BUN 6 - 20 mg/dL <5(L)  Creatinine 0.44 - 1.00 mg/dL 0.56  Sodium 135 - 145 mmol/L 135  Potassium 3.5 - 5.1 mmol/L 4.0  Chloride 98 - 111 mmol/L 108  CO2 22 - 32 mmol/L 16(L)  Calcium 8.9 - 10.3 mg/dL 8.2(L)  Total Protein 6.5 - 8.1 g/dL 6.4(L)  Total Bilirubin 0.3 - 1.2 mg/dL 0.9  Alkaline Phos 38 - 126 U/L 164(H)  AST 15 - 41 U/L 19  ALT 0 - 44 U/L 14   Edinburgh Score: Edinburgh Postnatal Depression Scale Screening Tool 01/10/2020  I have been able to laugh and see the funny side of things. 0  I have looked forward with enjoyment to things. 0  I have blamed myself unnecessarily when things went wrong. 0  I have been anxious or worried for no good reason. 0  I have felt scared or panicky for no good reason. 0  Things have been getting on top of me. 0  I have been so unhappy that I have had difficulty sleeping. 0  I have felt sad or miserable. 0  I have been so unhappy that I have been crying. 0  The thought of harming myself has occurred to me. 0  Edinburgh Postnatal Depression Scale Total 0    Discharge instruction: per After Visit Summary and "Baby and Me Booklet".  After visit meds:  Allergies as of 01/11/2020   No Known Allergies     Medication List    STOP taking these medications   [place]     TAKE these medications   Blood Pressure Kit  Devi 1 Device by Does not apply route as needed.   ibuprofen 600 MG tablet Commonly known as: ADVIL Take 1 tablet (600 mg total) by mouth every 6 (six) hours.   NIFEdipine 30 MG 24 hr tablet Commonly known as: ADALAT CC Take 1 tablet (30 mg total) by mouth daily.   PrePLUS 27-1 MG Tabs Take 1 tablet by mouth daily.       Diet: routine diet  Activity: Advance as tolerated. Pelvic rest for 6 weeks.   Outpatient follow up:6 weeks Follow up Appt: Future Appointments  Date Time Provider [place]  01/20/2020 10:00 AM [place]  02/17/2020  2:15 PM Laury Deep, CNM [facility]   Follow up Visit:   Please schedule this patient for Postpartum visit in: 6 weeks with the following provider: Any provider In-Person For C/S patients schedule nurse incision check in weeks 2 weeks: no High risk pregnancy complicated by: PreE with severe features Delivery mode:  SVD Anticipated Birth Control:  IUD vs nexplanon PP Procedures needed: BP check  Schedule Integrated [place] visit: no   Newborn Data: Live born female  Birth Weight:   APGAR: 41, 9  Newborn Delivery   Time head delivered: 01/09/2020 03:45:00 Birth date/time: 01/09/2020 03:46:00 Delivery type: Vaginal, Spontaneous      Baby Feeding: Breast Disposition:home with mother   01/11/2020 Mora Bellman, MD

## 2020-01-10 NOTE — Lactation Note (Signed)
This note was copied from a baby's chart. Lactation Consultation Note  Patient Name: Lisa Cook PTCKF'W Date: 01/10/2020 Reason for consult: Follow-up assessment;Late-preterm 34-36.6wks Baby is 29 hours old/3% weight loss.  Mom states she is formula feeding because baby would not latch.  Offered assist with breastfeeding and mom said she would call.  Stressed importance of pumping every 3 hours to establish a good milk supply.  Maternal Data    Feeding    LATCH Score                   Interventions    Lactation Tools Discussed/Used     Consult Status Consult Status: Follow-up Date: 01/11/20 Follow-up type: In-patient    Huston Foley 01/10/2020, 9:43 AM

## 2020-01-10 NOTE — Plan of Care (Signed)
  Problem: Clinical Measurements: Goal: Complications related to disease process, condition or treatment will be avoided or minimized Outcome: Progressing   

## 2020-01-10 NOTE — Progress Notes (Signed)
Post Partum Day 1 Subjective: no complaints, up ad lib, voiding and tolerating PO  Objective: Blood pressure 112/63, pulse 69, temperature 98 F (36.7 C), temperature source Oral, resp. rate 18, height 5\' 5"  (1.651 m), weight 83.6 kg, last menstrual period 04/27/2019, SpO2 99 %, unknown if currently breastfeeding.  Vitals:   01/09/20 1943 01/09/20 2311 01/10/20 0340 01/10/20 0645  BP: 130/83 120/78 123/71 112/63  Pulse: 72 77 72 69  Resp: 17 17 18    Temp: 98.6 F (37 C) 98.1 F (36.7 C) 98 F (36.7 C)   TempSrc: Oral Oral Oral   SpO2: 98% 98% 98% 99%  Weight:      Height:        Physical Exam:  General: alert, cooperative and no distress Lochia: appropriate Uterine Fundus: firm Incision:  DVT Evaluation: No evidence of DVT seen on physical exam.  Recent Labs    01/08/20 1651 01/09/20 0434  HGB 12.8 11.1*  HCT 39.4 34.1*    Assessment/Plan: Plan for discharge tomorrow and Breastfeeding Off magnesium and BP is good on no meds at this point  LOS: 2 days   01/10/20 01/10/2020, 7:11 AM

## 2020-01-11 LAB — CULTURE, BETA STREP (GROUP B ONLY): Strep Gp B Culture: POSITIVE — AB

## 2020-01-11 MED ORDER — IBUPROFEN 600 MG PO TABS: 600.0000 mg | ORAL_TABLET | Freq: Four times a day (QID) | ORAL | 3 refills | Status: DC

## 2020-01-11 MED ORDER — NIFEDIPINE ER OSMOTIC RELEASE 30 MG PO TB24
30.0000 mg | ORAL_TABLET | Freq: Every day | ORAL | Status: DC
  Administered 2020-01-11: 30 mg via ORAL
  Filled 2020-01-11: qty 1

## 2020-01-11 MED ORDER — NIFEDIPINE ER 30 MG PO TB24: 30.0000 mg | ORAL_TABLET | Freq: Every day | ORAL | 1 refills | Status: DC

## 2020-01-11 NOTE — Plan of Care (Signed)
Discharge instructions with teaching given, patient receptive to all teaching.

## 2020-01-11 NOTE — Lactation Note (Signed)
This note was copied from a baby's chart. Lactation Consultation Note  Patient Name: Lisa Cook NIOEV'O Date: 01/11/2020 Reason for consult: Follow-up assessment;Late-preterm 34-36.6wks   P1 mom ready for DC.  She is leaning toward formula feeding only but states she did put infant to the breast this morning.  Her breast feel "a little heavier" today.  Mom isn't pumping but LC encouraged her to take her tubing and pump equipment incase she needs it in the future.    Engorgement prevention reviewed and mom is aware of using ice/cabbage leaves, and hand expression to relieve pressure if needed if she is going to only formula feed.    Mom was counseled on steps if she decides to breastfeed and offer formula, and steps for if she decides to formula feed only.  Mom has lactation brochure and is aware of services and phone numbers to call for questions or concerns.     Maternal Data    Feeding    LATCH Score                   Interventions    Lactation Tools Discussed/Used     Consult Status Consult Status: Complete Date: 01/11/20 Follow-up type: In-patient    Maryruth Hancock [facility] 01/11/2020, 11:54 AM

## 2020-01-11 NOTE — Discharge Instructions (Signed)
Postpartum Care After Vaginal Delivery This sheet gives you information about how to care for yourself from the time you deliver your baby to up to 6-12 weeks after delivery (postpartum period). Your health care provider may also give you more specific instructions. If you have problems or questions, contact your health care provider. Follow these instructions at home: Vaginal bleeding  It is normal to have vaginal bleeding (lochia) after delivery. Wear a sanitary pad for vaginal bleeding and discharge. ? During the first week after delivery, the amount and appearance of lochia is often similar to a menstrual period. ? Over the next few weeks, it will gradually decrease to a dry, yellow-brown discharge. ? For most women, lochia stops completely by 4-6 weeks after delivery. Vaginal bleeding can vary from woman to woman.  Change your sanitary pads frequently. Watch for any changes in your flow, such as: ? A sudden increase in volume. ? A change in color. ? Large blood clots.  If you pass a blood clot from your vagina, save it and call your health care provider to discuss. Do not flush blood clots down the toilet before talking with your health care provider.  Do not use tampons or douches until your health care provider says this is safe.  If you are not breastfeeding, your period should return 6-8 weeks after delivery. If you are feeding your child breast milk only (exclusive breastfeeding), your period may not return until you stop breastfeeding. Perineal care  Keep the area between the vagina and the anus (perineum) clean and dry as told by your health care provider. Use medicated pads and pain-relieving sprays and creams as directed.  If you had a cut in the perineum (episiotomy) or a tear in the vagina, check the area for signs of infection until you are healed. Check for: ? More redness, swelling, or pain. ? Fluid or blood coming from the cut or tear. ? Warmth. ? Pus or a bad  smell.  You may be given a squirt bottle to use instead of wiping to clean the perineum area after you go to the bathroom. As you start healing, you may use the squirt bottle before wiping yourself. Make sure to wipe gently.  To relieve pain caused by an episiotomy, a tear in the vagina, or swollen veins in the anus (hemorrhoids), try taking a warm sitz bath 2-3 times a day. A sitz bath is a warm water bath that is taken while you are sitting down. The water should only come up to your hips and should cover your buttocks. Breast care  Within the first few days after delivery, your breasts may feel heavy, full, and uncomfortable (breast engorgement). Milk may also leak from your breasts. Your health care provider can suggest ways to help relieve the discomfort. Breast engorgement should go away within a few days.  If you are breastfeeding: ? Wear a bra that supports your breasts and fits you well. ? Keep your nipples clean and dry. Apply creams and ointments as told by your health care provider. ? You may need to use breast pads to absorb milk that leaks from your breasts. ? You may have uterine contractions every time you breastfeed for up to several weeks after delivery. Uterine contractions help your uterus return to its normal size. ? If you have any problems with breastfeeding, work with your health care provider or lactation consultant.  If you are not breastfeeding: ? Avoid touching your breasts a lot. Doing this can make   your breasts produce more milk. ? Wear a good-fitting bra and use cold packs to help with swelling. ? Do not squeeze out (express) milk. This causes you to make more milk. Intimacy and sexuality  Ask your health care provider when you can engage in sexual activity. This may depend on: ? Your risk of infection. ? How fast you are healing. ? Your comfort and desire to engage in sexual activity.  You are able to get pregnant after delivery, even if you have not had  your period. If desired, talk with your health care provider about methods of birth control (contraception). Medicines  Take over-the-counter and prescription medicines only as told by your health care provider.  If you were prescribed an antibiotic medicine, take it as told by your health care provider. Do not stop taking the antibiotic even if you start to feel better. Activity  Gradually return to your normal activities as told by your health care provider. Ask your health care provider what activities are safe for you.  Rest as much as possible. Try to rest or take a nap while your baby is sleeping. Eating and drinking   Drink enough fluid to keep your urine pale yellow.  Eat high-fiber foods every day. These may help prevent or relieve constipation. High-fiber foods include: ? Whole grain cereals and breads. ? Brown rice. ? Beans. ? Fresh fruits and vegetables.  Do not try to lose weight quickly by cutting back on calories.  Take your prenatal vitamins until your postpartum checkup or until your health care provider tells you it is okay to stop. Lifestyle  Do not use any products that contain nicotine or tobacco, such as cigarettes and e-cigarettes. If you need help quitting, ask your health care provider.  Do not drink alcohol, especially if you are breastfeeding. General instructions  Keep all follow-up visits for you and your baby as told by your health care provider. Most women visit their health care provider for a postpartum checkup within the first 3-6 weeks after delivery. Contact a health care provider if:  You feel unable to cope with the changes that your child brings to your life, and these feelings do not go away.  You feel unusually sad or worried.  Your breasts become red, painful, or hard.  You have a fever.  You have trouble holding urine or keeping urine from leaking.  You have little or no interest in activities you used to enjoy.  You have not  breastfed at all and you have not had a menstrual period for 12 weeks after delivery.  You have stopped breastfeeding and you have not had a menstrual period for 12 weeks after you stopped breastfeeding.  You have questions about caring for yourself or your baby.  You pass a blood clot from your vagina. Get help right away if:  You have chest pain.  You have difficulty breathing.  You have sudden, severe leg pain.  You have severe pain or cramping in your lower abdomen.  You bleed from your vagina so much that you fill more than one sanitary pad in one hour. Bleeding should not be heavier than your heaviest period.  You develop a severe headache.  You faint.  You have blurred vision or spots in your vision.  You have bad-smelling vaginal discharge.  You have thoughts about hurting yourself or your baby. If you ever feel like you may hurt yourself or others, or have thoughts about taking your own life, get help   right away. You can go to the nearest emergency department or call:  Your local emergency services (911 in the [place]  A suicide crisis helpline, such as the National Suicide Prevention Lifeline at 1-800-273-8255. This is open 24 hours a day. Summary  The period of time right after you deliver your newborn up to 6-12 weeks after delivery is called the postpartum period.  Gradually return to your normal activities as told by your health care provider.  Keep all follow-up visits for you and your baby as told by your health care provider. This information is not intended to replace advice given to you by your health care provider. Make sure you discuss any questions you have with your health care provider. Document Revised: 09/07/2017 Document Reviewed: 06/18/2017 Elsevier Patient Education  2020 Elsevier Inc. Postpartum Hypertension Postpartum hypertension is high blood pressure that remains higher than normal after childbirth. You may not realize that you have  postpartum hypertension if your blood pressure is not being checked regularly. In most cases, postpartum hypertension will go away on its own, usually within a week of delivery. However, for some women, medical treatment is required to prevent serious complications, such as seizures or stroke. What are the causes? This condition may be caused by one or more of the following:  Hypertension that existed before pregnancy (chronic hypertension).  Hypertension that comes on as a result of pregnancy (gestational hypertension).  Hypertensive disorders during pregnancy (preeclampsia) or seizures in women who have high blood pressure during pregnancy (eclampsia).  A condition in which the liver, platelets, and red blood cells are damaged during pregnancy (HELLP syndrome).  A condition in which the thyroid produces too much hormones (hyperthyroidism).  Other rare problems of the nerves (neurological disorders) or blood disorders. In some cases, the cause may not be known. What increases the risk? The following factors may make you more likely to develop this condition:  Chronic hypertension. In some cases, this may not have been diagnosed before pregnancy.  Obesity.  Type 2 diabetes.  Kidney disease.  History of preeclampsia or eclampsia.  Other medical conditions that change the level of hormones in the body (hormonal imbalance). What are the signs or symptoms? As with all types of hypertension, postpartum hypertension may not have any symptoms. Depending on how high your blood pressure is, you may experience:  Headaches. These may be mild, moderate, or severe. They may also be steady, Lisa Cook, or sudden in onset (thunderclap headache).  Changes in your ability to see (visual changes).  Dizziness.  Shortness of breath.  Swelling of your hands, feet, lower legs, or face. In some cases, you may have swelling in more than one of these locations.  Heart palpitations or a racing  heartbeat.  Difficulty breathing while lying down.  Decrease in the amount of urine that you pass. Other rare signs and symptoms may include:  Sweating more than usual. This lasts longer than a few days after delivery.  Chest pain.  Sudden dizziness when you get up from sitting or lying down.  Seizures.  Nausea or vomiting.  Abdominal pain. How is this diagnosed? This condition may be diagnosed based on the results of a physical exam, blood pressure measurements, and blood and urine tests. You may also have other tests, such as a CT scan or an MRI, to check for other problems of postpartum hypertension. How is this treated? If blood pressure is high enough to require treatment, your options may include:  Medicines to reduce blood pressure (  antihypertensives). Tell your health care provider if you are breastfeeding or if you plan to breastfeed. There are many antihypertensive medicines that are safe to take while breastfeeding.  Stopping medicines that may be causing hypertension.  Treating medical conditions that are causing hypertension.  Treating the complications of hypertension, such as seizures, stroke, or kidney problems. Your health care provider will also continue to monitor your blood pressure closely until it is within a safe range for you. Follow these instructions at home:  Take over-the-counter and prescription medicines only as told by your health care provider.  Return to your normal activities as told by your health care provider. Ask your health care provider what activities are safe for you.  Do not use any products that contain nicotine or tobacco, such as cigarettes and e-cigarettes. If you need help quitting, ask your health care provider.  Keep all follow-up visits as told by your health care provider. This is important. Contact a health care provider if:  Your symptoms get worse.  You have new symptoms, such as: ? A headache that does not get  better. ? Dizziness. ? Visual changes. Get help right away if:  You suddenly develop swelling in your hands, ankles, or face.  You have sudden, rapid weight gain.  You develop difficulty breathing, chest pain, racing heartbeat, or heart palpitations.  You develop severe pain in your abdomen.  You have any symptoms of a stroke. "BE FAST" is an easy way to remember the main warning signs of a stroke: ? B - Balance. Signs are dizziness, sudden trouble walking, or loss of balance. ? E - Eyes. Signs are trouble seeing or a sudden change in vision. ? F - Face. Signs are sudden weakness or numbness of the face, or the face or eyelid drooping on one side. ? A - Arms. Signs are weakness or numbness in an arm. This happens suddenly and usually on one side of the body. ? S - Speech. Signs are sudden trouble speaking, slurred speech, or trouble understanding what people say. ? T - Time. Time to call emergency services. Write down what time symptoms started.  You have other signs of a stroke, such as: ? A sudden, severe headache with no known cause. ? Nausea or vomiting. ? Seizure. These symptoms may represent a serious problem that is an emergency. Do not wait to see if the symptoms will go away. Get medical help right away. Call your local emergency services (911 in the [place] Do not drive yourself to the hospital. Summary  Postpartum hypertension is high blood pressure that remains higher than normal after childbirth.  In most cases, postpartum hypertension will go away on its own, usually within a week of delivery.  For some women, medical treatment is required to prevent serious complications, such as seizures or stroke. This information is not intended to replace advice given to you by your health care provider. Make sure you discuss any questions you have with your health care provider. Document Revised: 10/11/2018 Document Reviewed: 06/25/2017 Elsevier Patient Education  2020 Elsevier  Inc.  

## 2020-01-13 ENCOUNTER — Encounter: Payer: Medicaid Other | Admitting: Nurse Practitioner

## 2020-01-20 ENCOUNTER — Ambulatory Visit: Payer: Medicaid Other

## 2020-02-05 ENCOUNTER — Other Ambulatory Visit: Payer: Self-pay | Admitting: Obstetrics and Gynecology

## 2020-02-17 ENCOUNTER — Telehealth: Payer: Self-pay | Admitting: Obstetrics and Gynecology

## 2020-02-17 ENCOUNTER — Ambulatory Visit: Payer: Medicaid Other | Admitting: Obstetrics and Gynecology

## 2020-02-17 NOTE — Telephone Encounter (Signed)
Patient stated she was going [place] the zoo, and needed to reschedule her appointment. She wants to make sure she will be able to get her IUD at this visit.

## 2020-03-15 ENCOUNTER — Encounter (INDEPENDENT_AMBULATORY_CARE_PROVIDER_SITE_OTHER): Payer: Medicaid Other | Admitting: Obstetrics and Gynecology

## 2020-03-15 ENCOUNTER — Encounter: Payer: Self-pay | Admitting: Obstetrics and Gynecology

## 2020-03-15 NOTE — Progress Notes (Signed)
Patient did not keep her appointment today.   Duane Lope, NP 03/15/2020 4:35 PM

## 2020-08-20 ENCOUNTER — Ambulatory Visit (HOSPITAL_COMMUNITY)
Admission: EM | Admit: 2020-08-20 | Discharge: 2020-08-20 | Disposition: A | Discharge status: Home / Self Care | Payer: Medicaid Other

## 2020-08-20 ENCOUNTER — Other Ambulatory Visit: Payer: Self-pay

## 2020-11-08 DIAGNOSIS — M546 Pain in thoracic spine: Secondary | ICD-10-CM | POA: Diagnosis not present

## 2020-11-08 DIAGNOSIS — Z6827 Body mass index (BMI) 27.0-27.9, adult: Secondary | ICD-10-CM | POA: Diagnosis not present

## 2020-11-08 DIAGNOSIS — N914 Secondary oligomenorrhea: Secondary | ICD-10-CM | POA: Diagnosis not present

## 2020-11-08 DIAGNOSIS — M62838 Other muscle spasm: Secondary | ICD-10-CM | POA: Diagnosis not present

## 2021-01-18 DIAGNOSIS — O23599 Infection of other part of genital tract in pregnancy, unspecified trimester: Secondary | ICD-10-CM | POA: Diagnosis not present

## 2021-01-18 DIAGNOSIS — Z3490 Encounter for supervision of normal pregnancy, unspecified, unspecified trimester: Secondary | ICD-10-CM | POA: Diagnosis not present

## 2021-01-18 DIAGNOSIS — E663 Overweight: Secondary | ICD-10-CM | POA: Diagnosis not present

## 2021-01-18 DIAGNOSIS — N911 Secondary amenorrhea: Secondary | ICD-10-CM | POA: Diagnosis not present

## 2021-01-18 DIAGNOSIS — R350 Frequency of micturition: Secondary | ICD-10-CM | POA: Diagnosis not present

## 2021-01-18 DIAGNOSIS — B9689 Other specified bacterial agents as the cause of diseases classified elsewhere: Secondary | ICD-10-CM | POA: Diagnosis not present

## 2021-03-03 DIAGNOSIS — Z9889 Other specified postprocedural states: Secondary | ICD-10-CM | POA: Diagnosis not present

## 2021-03-03 DIAGNOSIS — Z0000 Encounter for general adult medical examination without abnormal findings: Secondary | ICD-10-CM | POA: Diagnosis not present

## 2021-03-03 DIAGNOSIS — Z1159 Encounter for screening for other viral diseases: Secondary | ICD-10-CM | POA: Diagnosis not present

## 2021-03-03 DIAGNOSIS — Z114 Encounter for screening for human immunodeficiency virus [HIV]: Secondary | ICD-10-CM | POA: Diagnosis not present

## 2021-03-03 DIAGNOSIS — Z131 Encounter for screening for diabetes mellitus: Secondary | ICD-10-CM | POA: Diagnosis not present

## 2021-03-03 DIAGNOSIS — Z13220 Encounter for screening for lipoid disorders: Secondary | ICD-10-CM | POA: Diagnosis not present

## 2021-03-14 ENCOUNTER — Ambulatory Visit: Payer: Medicaid Other

## 2021-03-15 ENCOUNTER — Other Ambulatory Visit: Payer: Self-pay

## 2021-03-15 ENCOUNTER — Ambulatory Visit: Payer: Medicaid Other

## 2021-03-16 ENCOUNTER — Other Ambulatory Visit (HOSPITAL_COMMUNITY)
Admission: RE | Admit: 2021-03-16 | Discharge: 2021-03-16 | Disposition: A | Discharge status: Home / Self Care | Payer: Medicaid Other | Source: Ambulatory Visit | Attending: Family Medicine | Admitting: Family Medicine

## 2021-03-16 ENCOUNTER — Ambulatory Visit (INDEPENDENT_AMBULATORY_CARE_PROVIDER_SITE_OTHER): Payer: Medicaid Other

## 2021-03-16 VITALS — BP 128/85 | HR 64 | Wt 156.3 lb

## 2021-03-16 DIAGNOSIS — N898 Other specified noninflammatory disorders of vagina: Secondary | ICD-10-CM | POA: Diagnosis not present

## 2021-03-16 NOTE — Progress Notes (Signed)
Here today with complaint of vaginal discharge with a foul odor, denies itching or irritation. History significant for induced abortion 4 weeks ago; bleeding stopped 1 week after taking medication to induce abortion. Pt reports recurring vaginal infections, states PCP at [facility] has diagnosed as yeast infection in the past. Pt reports foul odor occurs after each menstrual period. Self swab instructions given and specimen obtained. Explained to pt this may be bacterial vaginosis, but would recommend waiting for vaginal swab results prior to treating. Pt agreeable to plan. Non-medication treatment and prevention options for bacterial vaginosis included in AVS. Encouraged pt to follow up with provider if symptoms continue. Pt expresses interest in birth control. Information given regarding types of birth control offered. Pt would not like to schedule today, will call when ready for appt.   Fleet Contras RN 03/16/21

## 2021-03-16 NOTE — Patient Instructions (Signed)
Natural Remedies for Bacterial Vaginosis   Option #1  1 Tbsp Fractitionated Coconut Oil  10 drops of Melaleuca (Tea Tree) Oil   Mix ingredients together well.  Soak 3-4 tampons (in applicators) in that mixture until all or mostly all mixture is soaked up into the tampons.  Insert 1 saturated tampon vaginally and wear overnight for 3-4 nights.     Option #2 (sometimes to be used in conjunction with option #1)  Fill tub with enough to cover lap/lower abdomen warm water.  Mix 1/2 cup of baking soda in water.  Soak in water/baking soda mixture for at least 20 minutes.  Be sure to swish water in between legs to get as much in vagina as possible.  This soak should be done after sexual intercourse and menstrual cycles.     GO WHITE:  Soap: UNSCENTED Dove (white box light green writing)  Laundry detergent (underwear)- Dreft or Arm n' Hammer unscented  WHITE 100% cotton panties (NOT just cotton crouch)  Sanitary napkin/panty liners: UNSCENTED.  If it doesn't SAY unscented it can have a scent/perfume    NO PERFUMES OR LOTIONS OR POTIONS in the vulvar area (may use regular [place])  Condoms: hypoallergenic only. Non dyed (no color)  Toilet papers: white only  Wash clothes: use a separate wash cloth. WHITE.  Wash in Dreft.   You can purchase Tea Tree Oil locally at:   [employer]  [address]  [place] [address]   [employer]  [address]   [place] [address]   Advise that these alternatives will not replace the need to be evaluated if symptoms persist. You will need to seek care at an OB/GYN provider.   

## 2021-03-18 NOTE — Progress Notes (Signed)
Patient was assessed and managed by nursing staff during this encounter. I have reviewed the chart and agree with the documentation and plan.   Idan Prime, MSN, CNM, IBCLC 03/18/21 11:15 AM  

## 2021-03-22 LAB — CERVICOVAGINAL ANCILLARY ONLY
Bacterial Vaginitis (gardnerella): POSITIVE — AB
Candida Glabrata: NEGATIVE
Candida Vaginitis: POSITIVE — AB
Chlamydia: NEGATIVE
Comment: NEGATIVE
Comment: NEGATIVE
Comment: NEGATIVE
Comment: NEGATIVE
Comment: NEGATIVE
Comment: NORMAL
Neisseria Gonorrhea: NEGATIVE
Trichomonas: NEGATIVE

## 2021-03-24 ENCOUNTER — Telehealth: Payer: Self-pay | Admitting: Family Medicine

## 2021-03-24 DIAGNOSIS — N760 Acute vaginitis: Secondary | ICD-10-CM

## 2021-03-24 DIAGNOSIS — B379 Candidiasis, unspecified: Secondary | ICD-10-CM

## 2021-03-24 DIAGNOSIS — B9689 Other specified bacterial agents as the cause of diseases classified elsewhere: Secondary | ICD-10-CM

## 2021-03-24 MED ORDER — METRONIDAZOLE 500 MG PO TABS: 500.0000 mg | ORAL_TABLET | Freq: Two times a day (BID) | ORAL | 0 refills | Status: DC

## 2021-03-24 MED ORDER — FLUCONAZOLE 150 MG PO TABS: 150.0000 mg | ORAL_TABLET | Freq: Once | ORAL | 0 refills | Status: AC

## 2021-03-24 NOTE — Telephone Encounter (Signed)
Returned pt's phone call. Pt recently tested positive for yeast and BV. Treatment sent per protocol. Followed up if pt would like to see provider for recurrent BV and birth control consultation. Pt agreeable. Front office to schedule appt; pt states she will look for appt date/time in MyChart.

## 2021-04-01 IMAGING — US US OB COMP LESS 14 WK
1 series · 15 of 28 positions shown · non-contrast
Comparison: None.

CLINICAL DATA: Pelvic pain affecting pregnancy. Pregnancy of
unknown location

EXAM:
OBSTETRIC <14 WK ULTRASOUND
TECHNIQUE: Transabdominal ultrasound was performed for evaluation of the
gestation as well as the maternal uterus and adnexal regions.

[Series 1: us ob comp less 14 wk · 15 of 33 slices shown]
[im 1/33]
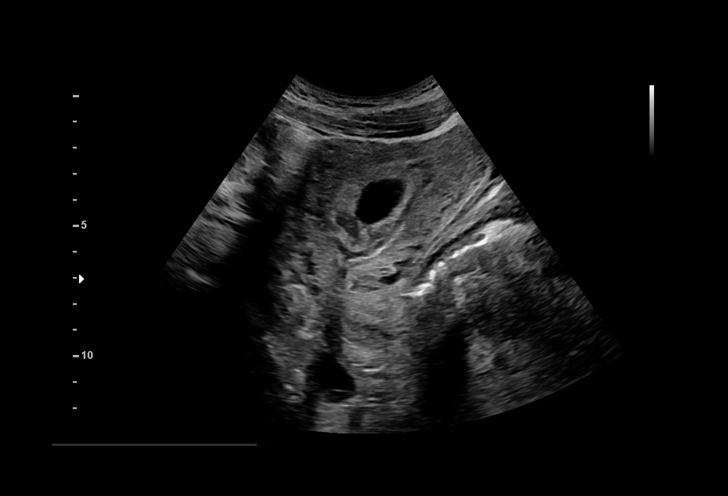
[im 3/33]
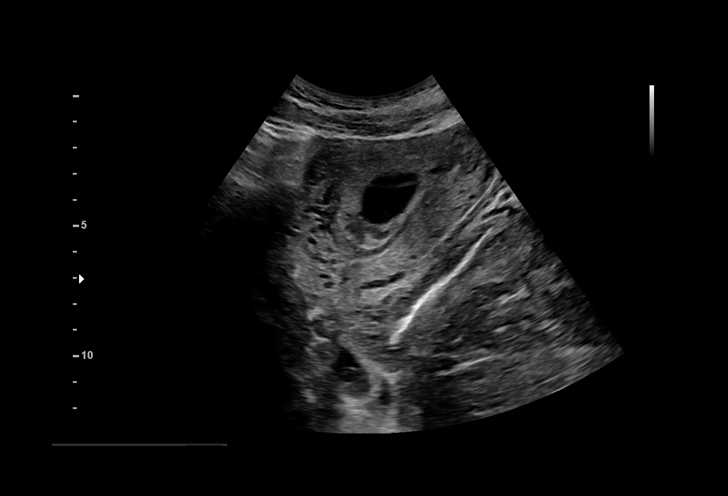
[im 5/33]
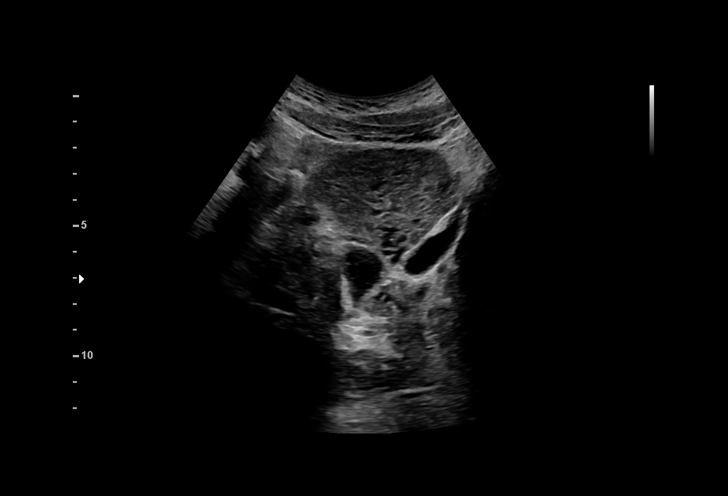
[im 8/33]
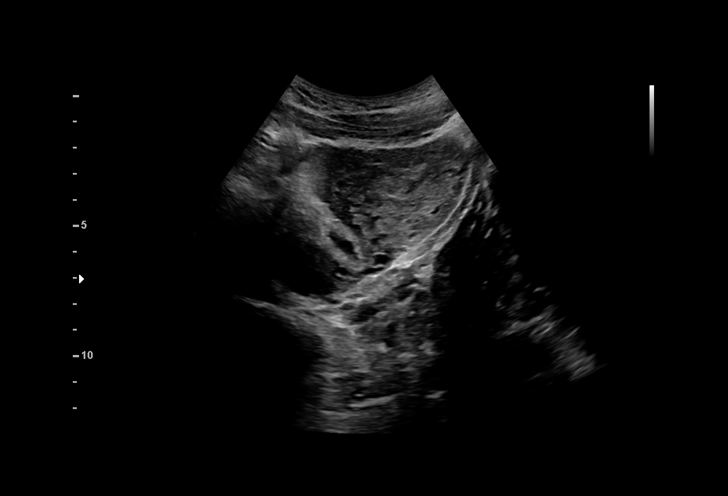
[im 10/33]
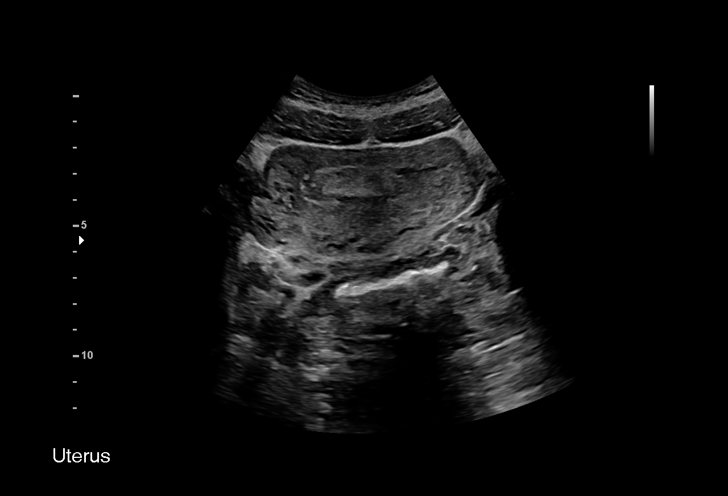
[im 12/33]
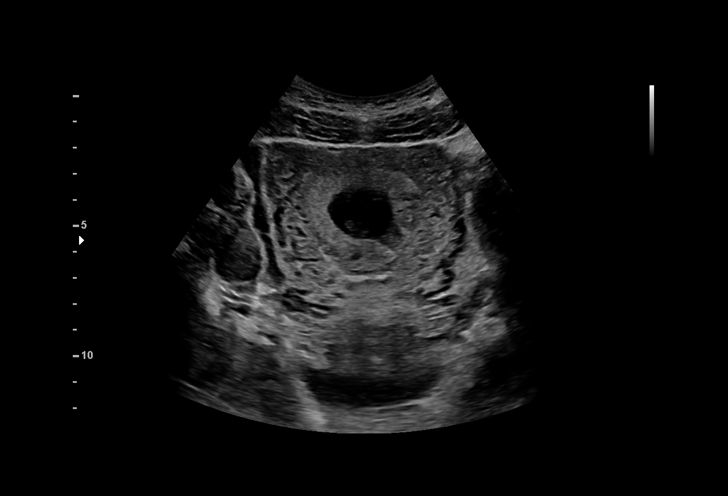
[im 15/33]
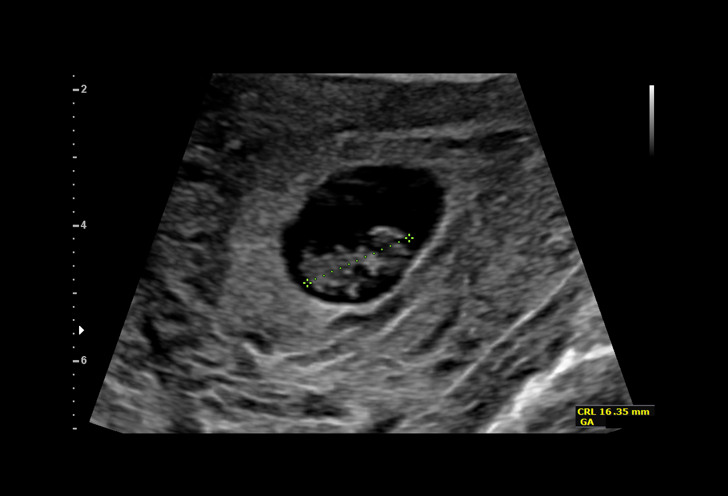
[im 17/33]
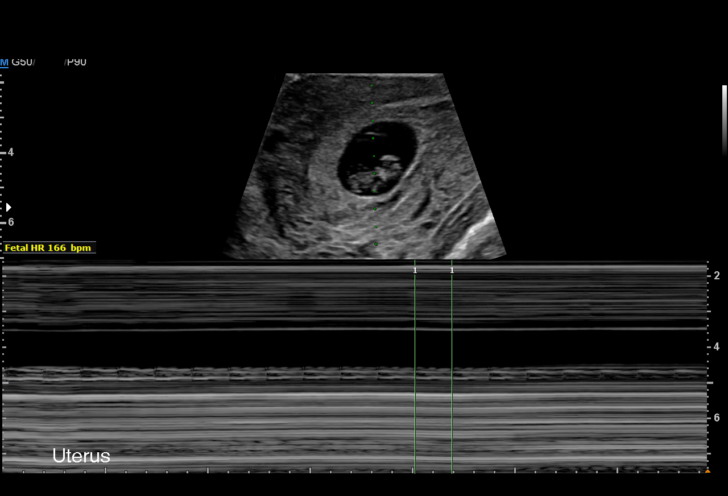
[im 18/33]
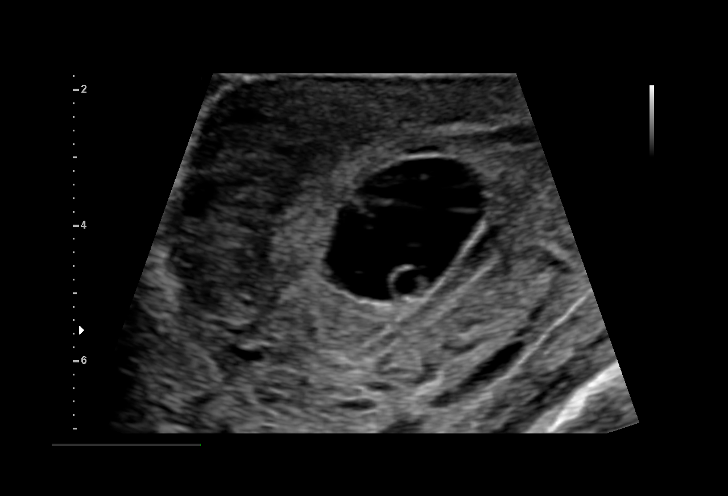
[im 21/33]
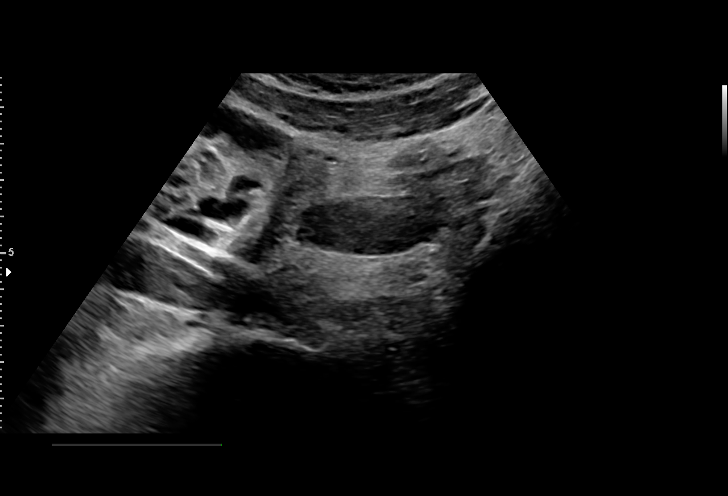
[im 23/33]
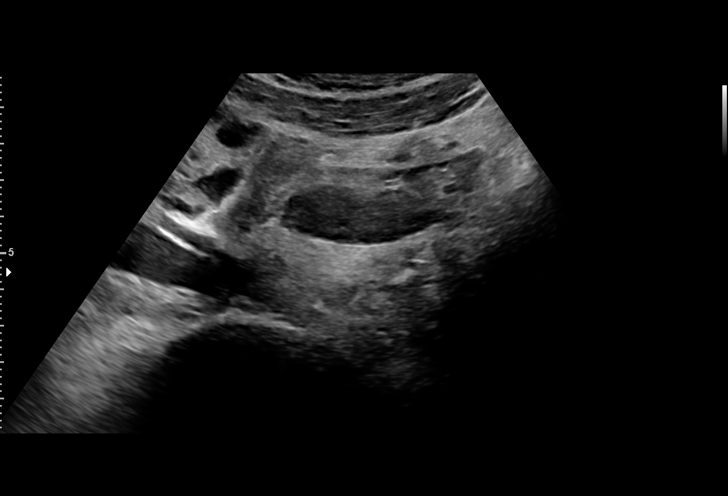
[im 25/33]
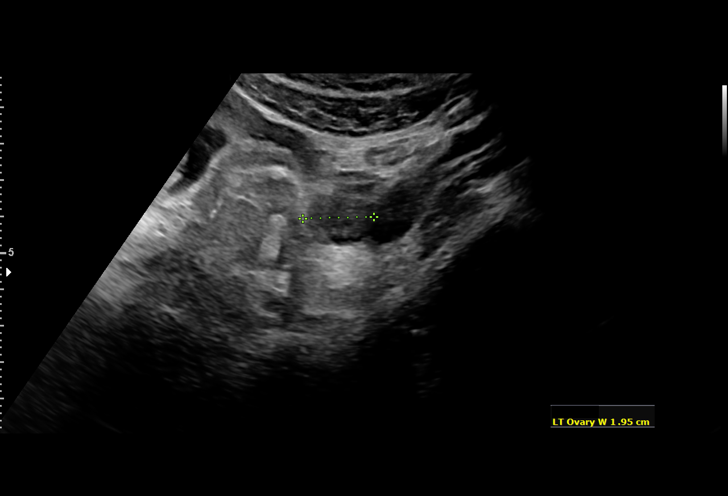
[im 28/33]
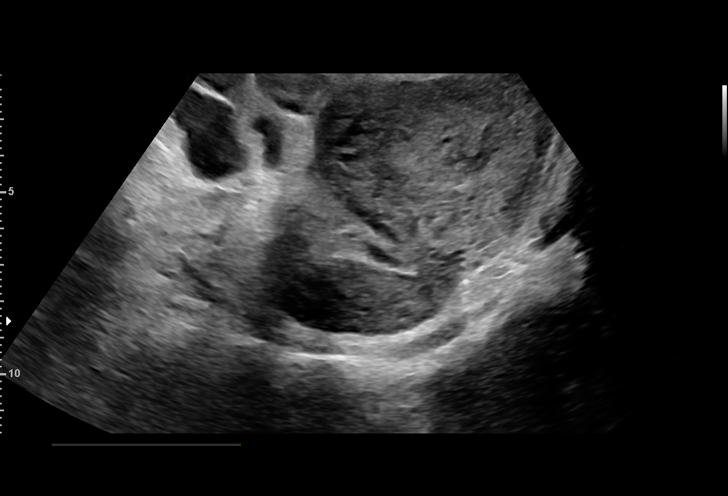
[im 30/33]
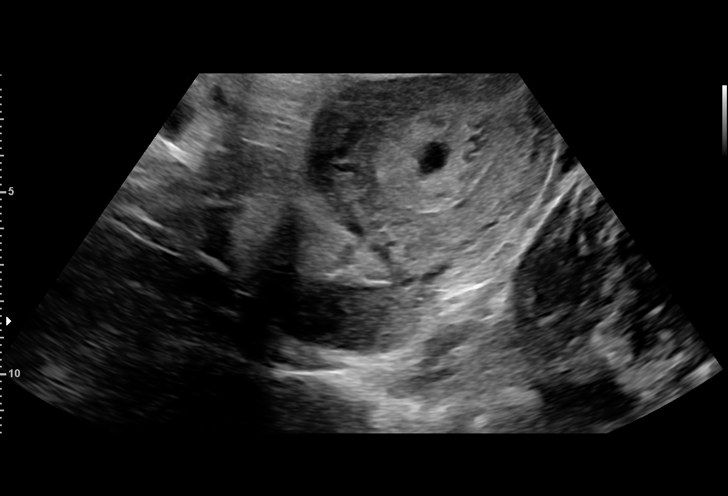
[im 33/33]
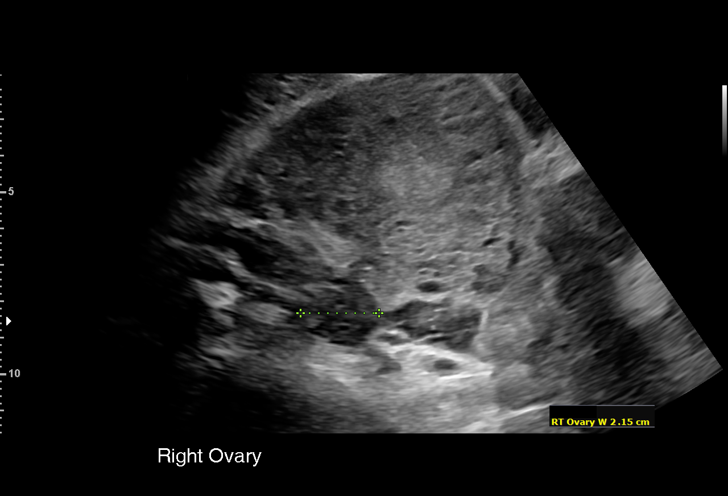

[15 of 28 positions shown; findings below may reference images not displayed]

FINDINGS: Intrauterine gestational sac: Single

Yolk sac:  Visualized.

Embryo:  Visualized.

Cardiac Activity: Visualized.

Heart Rate: 166 bpm

CRL:   17.1 mm   8 w 1 d                  US EDC: 02/04/2020

Subchorionic hemorrhage:  None visualized.

Maternal uterus/adnexae: No abnormal finding
IMPRESSION: Single living intrauterine pregnancy measuring 8 weeks 1 day. No
unexpected finding.

## 2021-04-05 ENCOUNTER — Ambulatory Visit: Payer: Medicaid Other | Admitting: Student

## 2021-06-14 IMAGING — US US MFM OB COMP +14 WKS
1 series · 13 of 28 positions shown · non-contrast
Comparison: none

[Series 1: us mfm ob comp +14 wks · 13 of 134 slices shown]
[im 5/134]
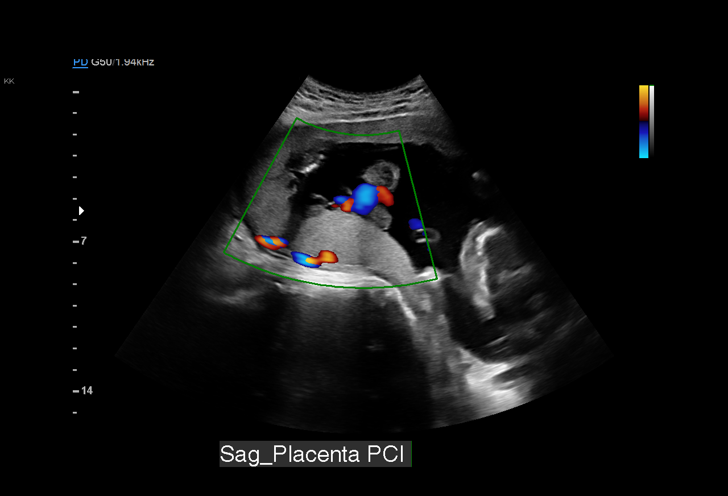
[im 15/134]
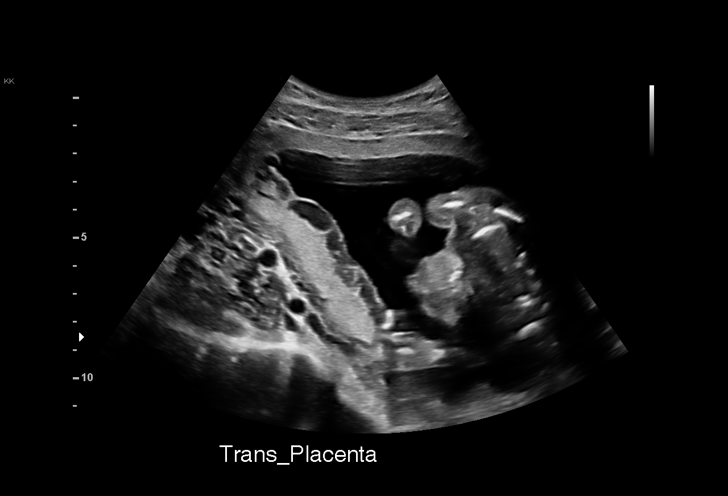
[im 25/134]
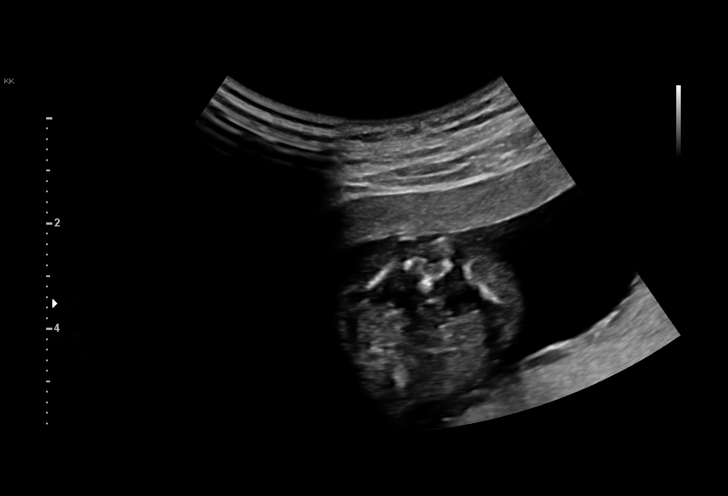
[im 35/134]
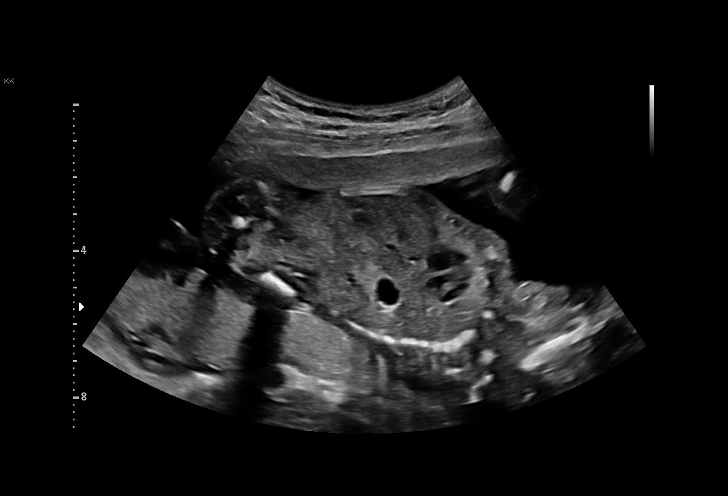
[im 45/134]
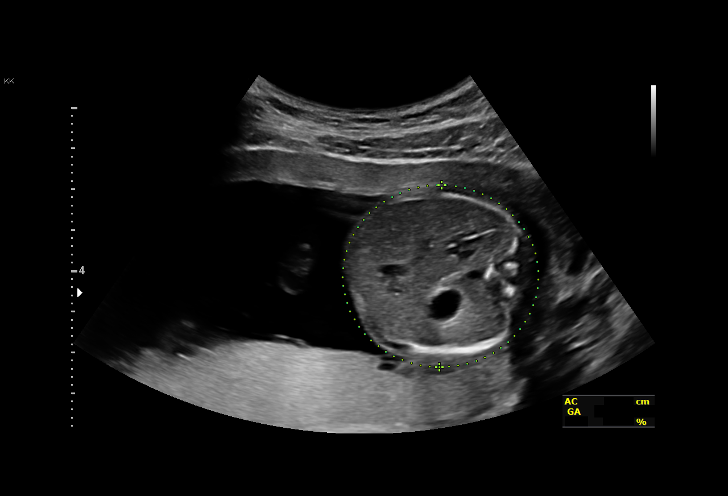
[im 55/134]
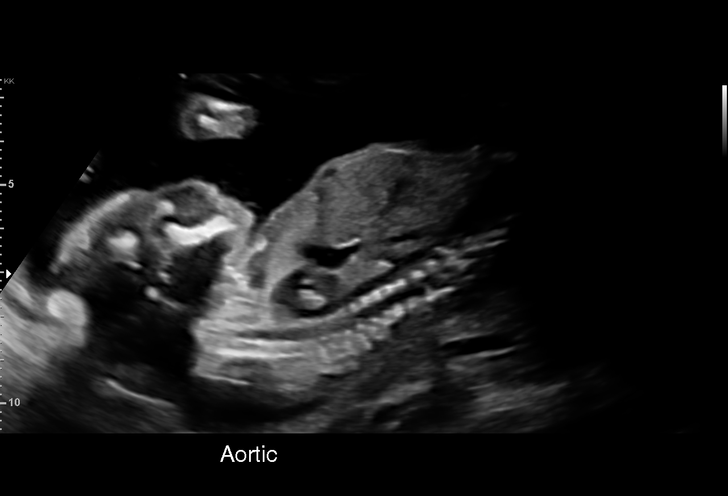
[im 69/134]
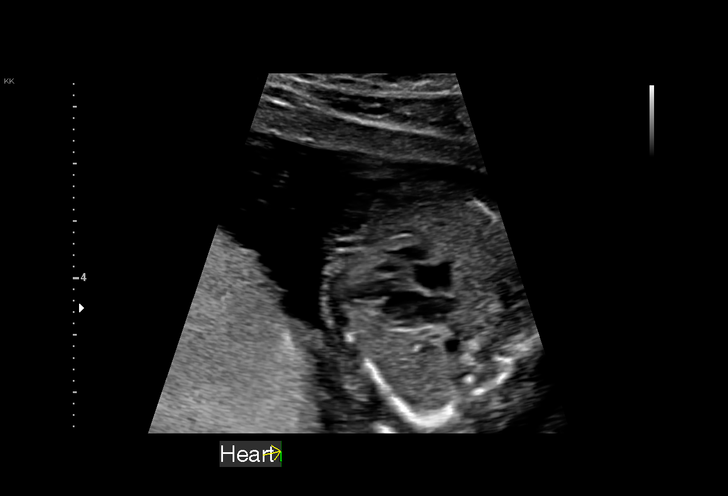
[im 79/134]
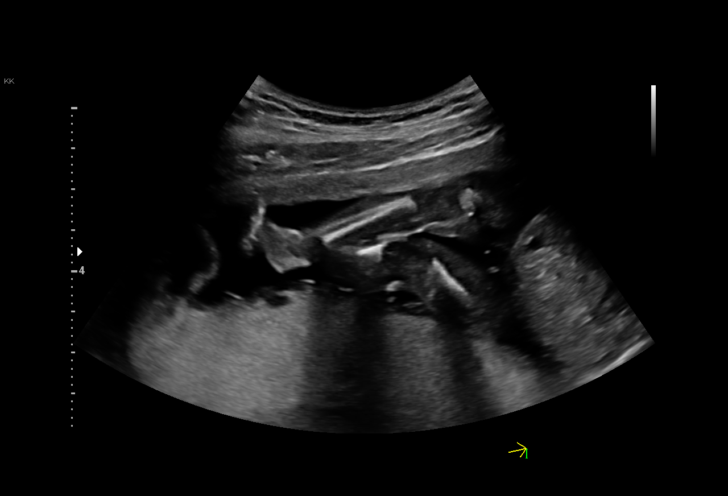
[im 89/134]
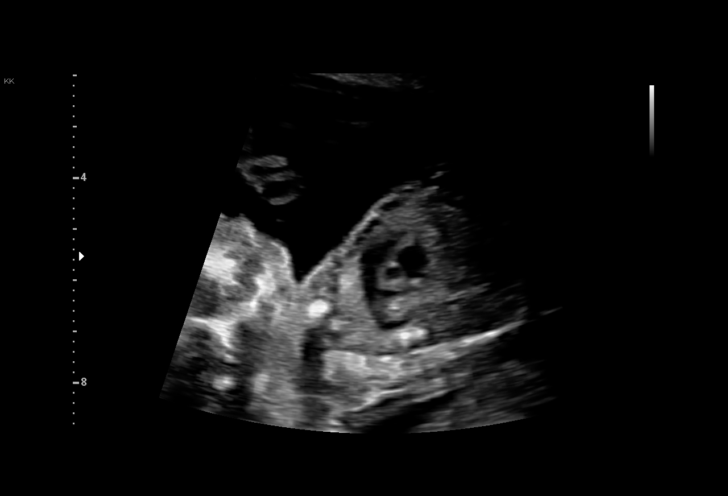
[im 99/134]
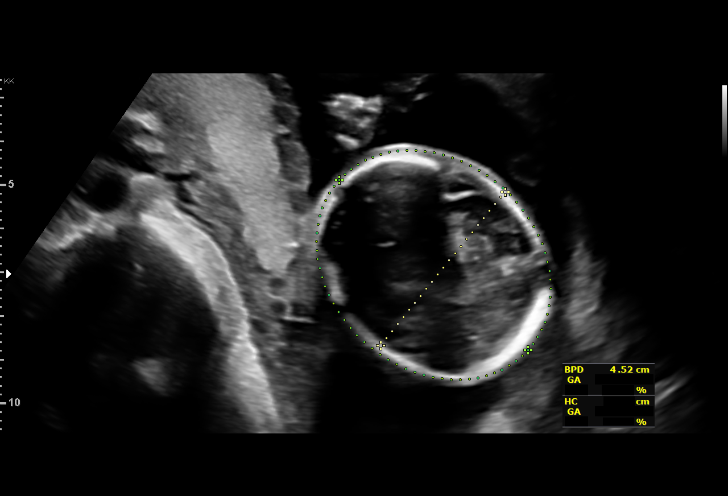
[im 109/134]
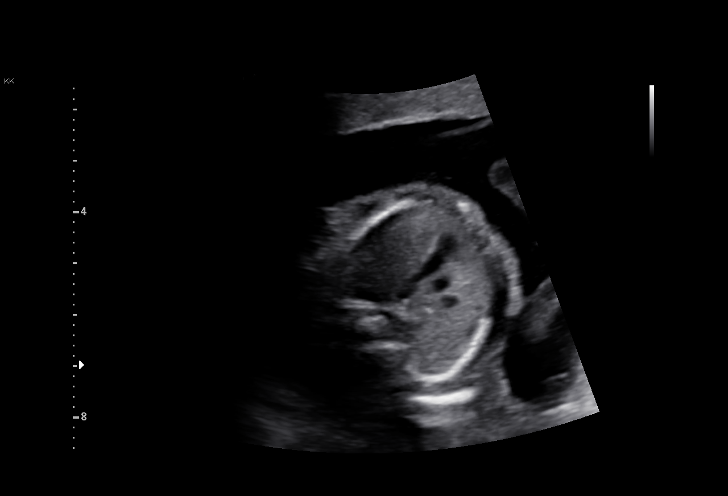
[im 119/134]
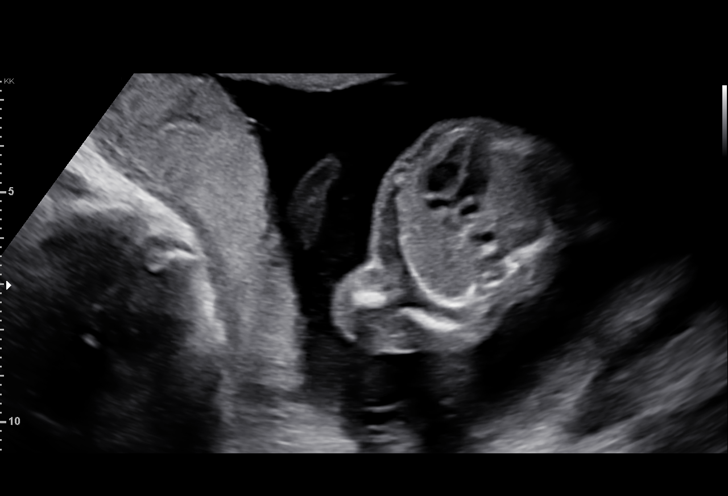
[im 129/134]
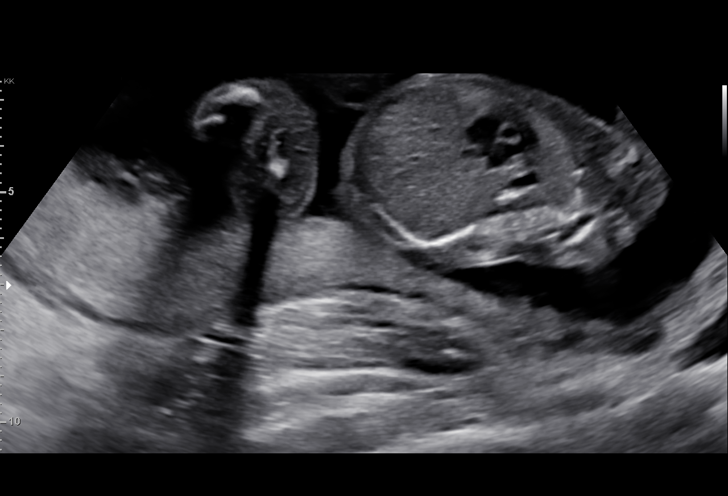

[13 of 28 positions shown; findings below may reference images not displayed]

1  US MFM OB COMP + 14 WK               [address]805.01     [facility]
 ----------------------------------------------------------------------

 ----------------------------------------------------------------------
Indications

  Encounter for antenatal screening for
  malformations
  Negative Horizon (Low Risk NIPS)
  19 weeks gestation of pregnancy
 ----------------------------------------------------------------------
Fetal Evaluation

 Num Of Fetuses:         1
 Fetal Heart Rate(bpm):  157
 Cardiac Activity:       Observed
 Presentation:           Cephalic
 Placenta:               Posterior
 P. Cord Insertion:      Previously Visualized

 Amniotic Fluid
 AFI FV:      Within normal limits

                             Largest Pocket(cm)

Biometry

 BPD:      46.2  mm     G. Age:  20w 0d         82  %    CI:        76.54   %    70 - 86
                                                         FL/HC:      17.1   %    16.1 -
 HC:      167.3  mm     G. Age:  19w 3d         55  %    HC/AC:      1.12        1.09 -
 AC:      148.9  mm     G. Age:  20w 1d         78  %    FL/BPD:     61.9   %
 FL:       28.6  mm     G. Age:  18w 5d         30  %    FL/AC:      19.2   %    20 - 24

 Est. FW:     297  gm    0 lb 10 oz      68  %
OB History
 Gravidity:    3         Term:   0        Prem:   0        SAB:   1
 TOP:          1       Ectopic:  0        Living: 0
Gestational Age

 LMP:           19w 1d        Date:  04/27/19                 EDD:   02/01/20
 U/S Today:     19w 4d                                        EDD:   01/29/20
 Best:          19w 1d     Det. By:  LMP  (04/27/19)          EDD:   02/01/20
Anatomy

 Cranium:               Appears normal         Aortic Arch:            Appears normal
 Cavum:                 Not well visualized    Ductal Arch:            Appears normal
 Ventricles:            Appears normal         Diaphragm:              Appears normal
 Choroid Plexus:        Appears normal         Stomach:                Appears normal, left
                                                                       sided
 Cerebellum:            Not well visualized    Abdomen:                Appears normal
 Posterior Fossa:       Not well visualized    Abdominal Wall:         Appears nml (cord
                                                                       insert, abd wall)
 Nuchal Fold:           Not well visualized    Cord Vessels:           Appears normal (3
                                                                       vessel cord)
 Face:                  Appears normal         Kidneys:                Appear normal
                        (orbits and profile)
 Lips:                  Not well visualized    Bladder:                Appears normal
 Palate:                Appears normal         Spine:                  Appears normal
 Thoracic:              Appears normal         Upper Extremities:      Appears normal
 Heart:                 Not well visualized    Lower Extremities:      Appears normal
 RVOT:                  Appears normal

 Other:  Male gender. Heels and 5th digit visualized. Technically difficult due
         to fetal position.
Cervix Uterus Adnexa

 Cervix
 Length:            3.5  cm.
 Normal appearance by transabdominal scan.
Comments

 This patient was seen for a detailed fetal anatomy scan.  She
 denies any significant past medical history and denies any
 problems in her current pregnancy.
 She had a cell free DNA test earlier in her pregnancy which
 indicated a low risk for trisomy 21, 18, and 13. A male fetus is
 predicted.
 She was informed that the fetal growth and amniotic fluid
 level were appropriate for her gestational age.
 There were no obvious fetal anomalies noted on today's
 ultrasound exam.  The views of the fetal anatomy were
 limited today due to the fetal position.
 The patient was informed that anomalies may be missed due
 to technical limitations. If the fetus is in a suboptimal position
 or maternal habitus is increased, visualization of the fetus in
 the maternal uterus may be impaired.
 A follow-up exam was scheduled in 4 to 5 weeks to obtain
 better views of the fetal anatomy.

## 2021-07-25 ENCOUNTER — Ambulatory Visit: Payer: Medicaid Other

## 2021-08-30 ENCOUNTER — Ambulatory Visit: Payer: Medicaid Other

## 2021-08-30 ENCOUNTER — Other Ambulatory Visit: Payer: Self-pay

## 2021-08-31 DIAGNOSIS — Z309 Encounter for contraceptive management, unspecified: Secondary | ICD-10-CM | POA: Diagnosis not present

## 2021-08-31 DIAGNOSIS — Z0130 Encounter for examination of blood pressure without abnormal findings: Secondary | ICD-10-CM | POA: Diagnosis not present

## 2021-08-31 DIAGNOSIS — N898 Other specified noninflammatory disorders of vagina: Secondary | ICD-10-CM | POA: Diagnosis not present

## 2021-08-31 DIAGNOSIS — N760 Acute vaginitis: Secondary | ICD-10-CM | POA: Diagnosis not present

## 2021-09-06 ENCOUNTER — Ambulatory Visit: Payer: Medicaid Other

## 2021-09-18 HISTORY — PX: INDUCED ABORTION: SHX677

## 2021-11-15 DIAGNOSIS — Z0130 Encounter for examination of blood pressure without abnormal findings: Secondary | ICD-10-CM | POA: Diagnosis not present

## 2021-11-15 DIAGNOSIS — M549 Dorsalgia, unspecified: Secondary | ICD-10-CM | POA: Diagnosis not present

## 2021-11-15 DIAGNOSIS — N62 Hypertrophy of breast: Secondary | ICD-10-CM | POA: Diagnosis not present

## 2021-11-15 DIAGNOSIS — N898 Other specified noninflammatory disorders of vagina: Secondary | ICD-10-CM | POA: Diagnosis not present

## 2021-11-15 DIAGNOSIS — R4589 Other symptoms and signs involving emotional state: Secondary | ICD-10-CM | POA: Diagnosis not present

## 2022-02-16 DIAGNOSIS — Z7251 High risk heterosexual behavior: Secondary | ICD-10-CM | POA: Diagnosis not present

## 2022-02-16 DIAGNOSIS — Z8619 Personal history of other infectious and parasitic diseases: Secondary | ICD-10-CM | POA: Diagnosis not present

## 2022-02-16 DIAGNOSIS — N766 Ulceration of vulva: Secondary | ICD-10-CM | POA: Diagnosis not present

## 2022-06-16 ENCOUNTER — Encounter (HOSPITAL_COMMUNITY): Payer: Self-pay | Admitting: *Deleted

## 2022-06-16 ENCOUNTER — Ambulatory Visit (HOSPITAL_COMMUNITY)
Admission: EM | Admit: 2022-06-16 | Discharge: 2022-06-16 | Disposition: A | Discharge status: Home / Self Care | Payer: Medicaid Other | Attending: Emergency Medicine | Admitting: Emergency Medicine

## 2022-06-16 DIAGNOSIS — Z23 Encounter for immunization: Secondary | ICD-10-CM | POA: Diagnosis not present

## 2022-06-16 DIAGNOSIS — S41051A Open bite of right shoulder, initial encounter: Secondary | ICD-10-CM

## 2022-06-16 DIAGNOSIS — W503XXA Accidental bite by another person, initial encounter: Secondary | ICD-10-CM | POA: Diagnosis not present

## 2022-06-16 MED ORDER — TETANUS-DIPHTH-ACELL PERTUSSIS 5-2.5-18.5 LF-MCG/0.5 IM SUSY
0.5000 mL | PREFILLED_SYRINGE | Freq: Once | INTRAMUSCULAR | Status: AC
  Administered 2022-06-16: 0.5 mL via INTRAMUSCULAR

## 2022-06-16 MED ORDER — CEPHALEXIN 500 MG PO CAPS: 500.0000 mg | ORAL_CAPSULE | Freq: Four times a day (QID) | ORAL | 0 refills | Status: DC

## 2022-06-16 MED ORDER — TETANUS-DIPHTH-ACELL PERTUSSIS 5-2.5-18.5 LF-MCG/0.5 IM SUSY
PREFILLED_SYRINGE | INTRAMUSCULAR | Status: AC
  Filled 2022-06-16: qty 0.5
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
## 2022-06-16 NOTE — Discharge Instructions (Addendum)
Patient should wash and dry the area very well keep it covered. Take full dose of antibiotics with food If symptoms become worse patient should be seen either by [employer] or return to the urgent care Take Tylenol or Motrin as needed for pain
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
## 2022-07-05 ENCOUNTER — Ambulatory Visit (INDEPENDENT_AMBULATORY_CARE_PROVIDER_SITE_OTHER): Payer: Medicaid Other | Admitting: General Practice

## 2022-07-05 ENCOUNTER — Other Ambulatory Visit (HOSPITAL_COMMUNITY)
Admission: RE | Admit: 2022-07-05 | Discharge: 2022-07-05 | Disposition: A | Discharge status: Home / Self Care | Payer: Medicaid Other | Source: Ambulatory Visit | Attending: Obstetrics and Gynecology | Admitting: Obstetrics and Gynecology

## 2022-07-05 VITALS — BP 139/91 | HR 76 | Ht 65.0 in | Wt 175.6 lb

## 2022-07-05 DIAGNOSIS — N898 Other specified noninflammatory disorders of vagina: Secondary | ICD-10-CM

## 2022-07-05 MED ORDER — FLUCONAZOLE 150 MG PO TABS: 150.0000 mg | ORAL_TABLET | Freq: Once | ORAL | 0 refills | Status: AC

## 2022-07-05 NOTE — Progress Notes (Signed)
Agree with nurses's documentation of this patient's clinic encounter.  Indiah Heyden L, MD  

## 2022-07-05 NOTE — Progress Notes (Signed)
SUBJECTIVE:  26 y.o. female complains of curd-like and malodorous vaginal discharge for 2 week(s). Denies abnormal vaginal bleeding or significant pelvic pain or fever. No UTI symptoms. Denies history of known exposure to STD.  No LMP recorded. (Menstrual status: Other).  OBJECTIVE:  She appears well, afebrile. Urine dipstick: not done.  ASSESSMENT:  Vaginal Discharge  Vaginal Odor   PLAN:  GC, chlamydia, trichomonas, BVAG, CVAG probe sent to lab. Treatment: Diflucan sent per protocol. Further testing to be determined once lab results are received ROV prn if symptoms persist or worsen.

## 2022-07-06 LAB — CERVICOVAGINAL ANCILLARY ONLY
Bacterial Vaginitis (gardnerella): POSITIVE — AB
Candida Glabrata: NEGATIVE
Candida Vaginitis: NEGATIVE
Chlamydia: NEGATIVE
Comment: NEGATIVE
Comment: NEGATIVE
Comment: NEGATIVE
Comment: NEGATIVE
Comment: NEGATIVE
Comment: NORMAL
Neisseria Gonorrhea: NEGATIVE
Trichomonas: NEGATIVE

## 2022-07-07 ENCOUNTER — Other Ambulatory Visit: Payer: Self-pay

## 2022-07-07 MED ORDER — FLUCONAZOLE 150 MG PO TABS: 150.0000 mg | ORAL_TABLET | Freq: Once | ORAL | 0 refills | Status: AC

## 2022-07-07 MED ORDER — METRONIDAZOLE 500 MG PO TABS: 500.0000 mg | ORAL_TABLET | Freq: Two times a day (BID) | ORAL | 0 refills | Status: DC

## 2022-07-07 NOTE — Progress Notes (Signed)
Pt called and saw +BV results, requesting medication and rx for yeast. Sent per protocol.

## 2022-07-12 ENCOUNTER — Ambulatory Visit: Payer: Medicaid Other

## 2022-08-02 ENCOUNTER — Telehealth: Payer: Self-pay | Admitting: Emergency Medicine

## 2022-08-02 NOTE — Telephone Encounter (Signed)
Attempted RC to patient, pt left voicemail on nurse line. Unable to leave message.

## 2022-08-07 ENCOUNTER — Other Ambulatory Visit: Payer: Self-pay | Admitting: Obstetrics and Gynecology

## 2022-08-08 ENCOUNTER — Telehealth: Payer: Self-pay | Admitting: Emergency Medicine

## 2022-08-08 ENCOUNTER — Other Ambulatory Visit: Payer: Self-pay | Admitting: Obstetrics and Gynecology

## 2022-08-08 MED ORDER — METRONIDAZOLE 500 MG PO TABS: 500.0000 mg | ORAL_TABLET | Freq: Two times a day (BID) | ORAL | 0 refills | Status: DC

## 2022-08-08 NOTE — Telephone Encounter (Signed)
Attempted RC call to patient. Unable to leave voicemail.

## 2022-08-08 NOTE — Telephone Encounter (Signed)
Duplicate request

## 2022-08-14 ENCOUNTER — Ambulatory Visit (INDEPENDENT_AMBULATORY_CARE_PROVIDER_SITE_OTHER): Payer: Medicaid Other | Admitting: *Deleted

## 2022-08-14 ENCOUNTER — Other Ambulatory Visit (HOSPITAL_COMMUNITY)
Admission: RE | Admit: 2022-08-14 | Discharge: 2022-08-14 | Disposition: A | Discharge status: Home / Self Care | Payer: Medicaid Other | Source: Ambulatory Visit | Attending: Obstetrics and Gynecology | Admitting: Obstetrics and Gynecology

## 2022-08-14 VITALS — BP 155/100 | HR 117

## 2022-08-14 DIAGNOSIS — N898 Other specified noninflammatory disorders of vagina: Secondary | ICD-10-CM | POA: Insufficient documentation

## 2022-08-14 NOTE — Progress Notes (Signed)
SUBJECTIVE:  26 y.o. female complains of clear and malodorous vaginal discharge for 2 week(s). Denies abnormal vaginal bleeding or significant pelvic pain or fever. No UTI symptoms. Denies history of known exposure to STD.  No LMP recorded. (Menstrual status: Other).  OBJECTIVE:  She appears well, afebrile. Urine dipstick: not done. Elevated BP, asymptomatic  ASSESSMENT:  Vaginal Discharge  Vaginal Odor BP 155/100   PLAN:  BVAG, CVAG probe sent to lab. Declines STI testing. Treatment: To be determined once lab results are received ROV prn if symptoms persist or worsen. Elevated BP discussed w/ Dr. Jolayne Panther. Pt educated on health risks of HTN and strongly advised to follow up ASAP with PCP. Pt verbalized understanding.

## 2022-08-15 LAB — CERVICOVAGINAL ANCILLARY ONLY
Bacterial Vaginitis (gardnerella): POSITIVE — AB
Candida Glabrata: NEGATIVE
Candida Vaginitis: NEGATIVE
Comment: NEGATIVE
Comment: NEGATIVE
Comment: NEGATIVE

## 2022-08-15 MED ORDER — METRONIDAZOLE 500 MG PO TABS: 500.0000 mg | ORAL_TABLET | Freq: Two times a day (BID) | ORAL | 0 refills | Status: DC

## 2022-08-15 NOTE — Addendum Note (Signed)
Addended by: Catalina Antigua on: 08/15/2022 11:49 AM   Modules accepted: Orders

## 2022-09-01 ENCOUNTER — Encounter: Payer: Self-pay | Admitting: Emergency Medicine

## 2022-09-01 ENCOUNTER — Other Ambulatory Visit: Payer: Self-pay | Admitting: Emergency Medicine

## 2022-09-01 MED ORDER — FLUCONAZOLE 150 MG PO TABS: 150.0000 mg | ORAL_TABLET | Freq: Once | ORAL | 0 refills | Status: AC

## 2022-09-01 NOTE — Progress Notes (Signed)
Rx for yeast. 

## 2022-11-08 ENCOUNTER — Other Ambulatory Visit: Payer: Self-pay | Admitting: Obstetrics and Gynecology

## 2022-11-10 ENCOUNTER — Other Ambulatory Visit: Payer: Self-pay | Admitting: Obstetrics

## 2022-12-22 ENCOUNTER — Other Ambulatory Visit: Payer: Self-pay

## 2022-12-22 DIAGNOSIS — B9689 Other specified bacterial agents as the cause of diseases classified elsewhere: Secondary | ICD-10-CM

## 2022-12-22 MED ORDER — METRONIDAZOLE 500 MG PO TABS: 500.0000 mg | ORAL_TABLET | Freq: Two times a day (BID) | ORAL | 0 refills | Status: DC

## 2022-12-25 ENCOUNTER — Other Ambulatory Visit: Payer: Self-pay | Admitting: *Deleted

## 2022-12-25 DIAGNOSIS — B379 Candidiasis, unspecified: Secondary | ICD-10-CM

## 2022-12-25 MED ORDER — FLUCONAZOLE 150 MG PO TABS: 150.0000 mg | ORAL_TABLET | Freq: Once | ORAL | 0 refills | Status: AC

## 2022-12-27 ENCOUNTER — Ambulatory Visit (INDEPENDENT_AMBULATORY_CARE_PROVIDER_SITE_OTHER): Payer: Medicaid Other | Admitting: Obstetrics and Gynecology

## 2022-12-27 ENCOUNTER — Encounter: Payer: Self-pay | Admitting: Obstetrics and Gynecology

## 2022-12-27 ENCOUNTER — Other Ambulatory Visit (HOSPITAL_COMMUNITY)
Admission: RE | Admit: 2022-12-27 | Discharge: 2022-12-27 | Disposition: A | Discharge status: Home / Self Care | Payer: Medicaid Other | Source: Ambulatory Visit | Attending: Obstetrics and Gynecology | Admitting: Obstetrics and Gynecology

## 2022-12-27 VITALS — BP 130/84 | HR 78 | Ht 64.0 in | Wt 181.0 lb

## 2022-12-27 DIAGNOSIS — N760 Acute vaginitis: Secondary | ICD-10-CM

## 2022-12-27 DIAGNOSIS — Z01419 Encounter for gynecological examination (general) (routine) without abnormal findings: Secondary | ICD-10-CM | POA: Diagnosis not present

## 2022-12-27 DIAGNOSIS — B9689 Other specified bacterial agents as the cause of diseases classified elsewhere: Secondary | ICD-10-CM | POA: Diagnosis not present

## 2022-12-27 NOTE — Progress Notes (Signed)
   WELL-WOMAN PHYSICAL & PAP Patient name: Lisa Cook MRN [id number]  Date of birth: Dec 25, 1995 Chief Complaint:   Gynecologic Exam  History of Present Illness:   Lisa Cook is a 27 y.o. [phone] African American female being seen today for a routine well-woman exam.  Current complaints: recurrent BV and yeast. Currently taking Flagyl and recently had medication for yeast. Wanted to speak about breasts being "too heavy." She wanted to talk about breast reduction.  PCP: [facility]      does not desire labs Patient's last menstrual period was 12/01/2022 (exact date). The current method of family planning is condoms.  Last pap 07/15/2019. Results were: normal Last mammogram: n/a. Family h/o breast cancer: No Last colonoscopy: n/a. Family h/o colorectal cancer: No Review of Systems:   Pertinent items are noted in HPI Denies any headaches, blurred vision, fatigue, shortness of breath, chest pain, abdominal pain, abnormal vaginal discharge/itching/odor/irritation, problems with periods, bowel movements, urination, or intercourse unless otherwise stated above. Pertinent History Reviewed:  Reviewed past medical,surgical, social and family history.  Reviewed problem list, medications and allergies. Physical Assessment:   Vitals:   12/27/22 1331  BP: 130/84  Pulse: 78  Weight: 181 lb (82.1 kg)  Height: 5\' 4"  (1.626 m)  Body mass index is 31.07 kg/m.        Physical Examination:   General appearance - well appearing, and in no distress  Mental status - alert, oriented to person, place, and time  Psych:  She has a normal mood and affect  Skin - warm and dry, normal color, no suspicious lesions noted  Chest - effort normal, all lung fields clear to auscultation bilaterally  Heart - normal rate and regular rhythm  Neck:  midline trachea, no thyromegaly or nodules Breasts - breasts appear normal, no suspicious masses, no skin or nipple changes or  axillary nodes  Abdomen - soft, nontender, nondistended, no masses or organomegaly  Pelvic - VULVA: normal appearing vulva with no masses, tenderness or lesions   VAGINA: normal appearing vagina with normal color and discharge, no lesions   CERVIX: normal appearing cervix without discharge or lesions, no CMT  Thin prep pap is done with reflex HR HPV cotesting  UTERUS: uterus is felt to be normal size, shape, consistency and nontender   ADNEXA: No adnexal masses or tenderness noted.  Rectal - deferred  Extremities:  No swelling or varicosities noted  No results found for this or any previous visit (from the past 24 hour(s)).  Assessment & Plan:  1) Encounter for annual routine gynecological examination - Cytology - PAP( [facility])   2) BV (bacterial vaginosis) - Information provided on alternative therapies given for BV and yeast   Labs/procedures today: pap  Mammogram at age 29 or sooner if problems Colonoscopy at age 21 or sooner if problems  No orders of the defined types were placed in this encounter.   Meds: No orders of the defined types were placed in this encounter.   Follow-up: Return in about 1 year (around 12/27/2023) for Annual Exam.  Raelyn Mora MSN, CNM 12/27/2022 2:25 PM

## 2022-12-27 NOTE — Progress Notes (Signed)
Patient presents for AEX. Last Pap: 07/15/2019 Contraception/Cycles: Does not desire, may desire pregnnacy Vaginal/Urinary Symptoms: Recurrent BV, before or after cycles. Has used boric acid suppositories and metronidazole STD Screen: Declines Other Concerns: none at this time

## 2022-12-27 NOTE — Patient Instructions (Signed)
Alternative Vaginitis Therapies  1) soak in tub of warm water waist high with 1/2 cup of baking soda in water for ~ 20 mins.  2) soak 3 tampons in 1 tablespoon of fractionated (liquid form) coconut oil with 10 drops of Melaleuca (Tea Tree) essential oil, insert 1 saturated tampon vaginally at bedtime x 3 days.   Both options are to be done after sexual intercourse, menses and when suspects Bacterial Vaginosis and/or yeast infection. Please be advised that these alternatives will not replace the need to be evaluated, if symptoms persist. You will need to seek care at an OB/GYN provider.  GO WHITE: Soap: UNSCENTED Dove (white box light green writing) Laundry detergent (underwear)- Dreft or Arm n' Hammer unscented WHITE 100% cotton panties (NOT just cotton crouch) Sanitary napkin/panty liners: UNSCENTED.  If it doesn't SAY unscented it can have a scent/perfume    NO PERFUMES OR LOTIONS OR POTIONS in the vulvar area (may use water-based or silicone-based lubricant) Condoms: hypoallergenic only. Non dyed (no color) Toilet papers: white only Wash clothes: use a separate wash cloth. WHITE.  Wash in Dreft.   You can purchase Tea Tree (Melaluca) Oil locally at:  [employer] [address] [place], [place] [address] [phone]  [employer] [address] ,  [address] [phone]  

## 2023-01-02 LAB — CYTOLOGY - PAP: Diagnosis: NEGATIVE

## 2023-01-04 ENCOUNTER — Encounter: Payer: Self-pay | Admitting: Obstetrics and Gynecology

## 2023-01-23 ENCOUNTER — Ambulatory Visit: Payer: Medicaid Other | Admitting: Obstetrics and Gynecology

## 2023-05-30 ENCOUNTER — Ambulatory Visit (HOSPITAL_COMMUNITY)
Admission: EM | Admit: 2023-05-30 | Discharge: 2023-05-30 | Disposition: A | Discharge status: Home / Self Care | Payer: Medicaid Other | Attending: Sports Medicine | Admitting: Sports Medicine

## 2023-05-30 ENCOUNTER — Encounter (HOSPITAL_COMMUNITY): Payer: Self-pay

## 2023-05-30 ENCOUNTER — Ambulatory Visit (INDEPENDENT_AMBULATORY_CARE_PROVIDER_SITE_OTHER): Payer: Medicaid Other

## 2023-05-30 DIAGNOSIS — M542 Cervicalgia: Secondary | ICD-10-CM

## 2023-05-30 DIAGNOSIS — M5412 Radiculopathy, cervical region: Secondary | ICD-10-CM | POA: Diagnosis not present

## 2023-05-30 DIAGNOSIS — G44319 Acute post-traumatic headache, not intractable: Secondary | ICD-10-CM

## 2023-05-30 MED ORDER — KETOROLAC TROMETHAMINE 30 MG/ML IJ SOLN
INTRAMUSCULAR | Status: AC
  Filled 2023-05-30: qty 1

## 2023-05-30 MED ORDER — KETOROLAC TROMETHAMINE 60 MG/2ML IM SOLN
30.0000 mg | Freq: Once | INTRAMUSCULAR | Status: AC
  Administered 2023-05-30: 30 mg via INTRAMUSCULAR

## 2023-05-30 MED ORDER — CYCLOBENZAPRINE HCL 10 MG PO TABS: 10.0000 mg | ORAL_TABLET | Freq: Three times a day (TID) | ORAL | 0 refills | Status: DC | PRN

## 2023-05-30 MED ORDER — METHYLPREDNISOLONE ACETATE 40 MG/ML IJ SUSP
40.0000 mg | Freq: Once | INTRAMUSCULAR | Status: AC
  Administered 2023-05-30: 40 mg via INTRAMUSCULAR

## 2023-05-30 MED ORDER — METHYLPREDNISOLONE ACETATE 40 MG/ML IJ SUSP
INTRAMUSCULAR | Status: AC
  Filled 2023-05-30: qty 1
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
## 2023-08-01 ENCOUNTER — Other Ambulatory Visit: Payer: Self-pay

## 2023-08-01 DIAGNOSIS — N760 Acute vaginitis: Secondary | ICD-10-CM

## 2023-08-01 MED ORDER — METRONIDAZOLE 500 MG PO TABS: 500.0000 mg | ORAL_TABLET | Freq: Two times a day (BID) | ORAL | 0 refills | Status: DC

## 2023-08-09 ENCOUNTER — Other Ambulatory Visit: Payer: Self-pay

## 2023-08-09 DIAGNOSIS — N898 Other specified noninflammatory disorders of vagina: Secondary | ICD-10-CM

## 2023-08-09 MED ORDER — FLUCONAZOLE 150 MG PO TABS: 150.0000 mg | ORAL_TABLET | Freq: Once | ORAL | 0 refills | Status: AC

## 2023-10-08 ENCOUNTER — Emergency Department (HOSPITAL_BASED_OUTPATIENT_CLINIC_OR_DEPARTMENT_OTHER)
Admission: EM | Admit: 2023-10-08 | Discharge: 2023-10-08 | Disposition: A | Discharge status: Home / Self Care | Payer: Medicaid Other | Attending: Emergency Medicine | Admitting: Emergency Medicine

## 2023-10-08 ENCOUNTER — Emergency Department (HOSPITAL_BASED_OUTPATIENT_CLINIC_OR_DEPARTMENT_OTHER): Payer: Medicaid Other

## 2023-10-08 ENCOUNTER — Encounter (HOSPITAL_BASED_OUTPATIENT_CLINIC_OR_DEPARTMENT_OTHER): Payer: Self-pay | Admitting: Emergency Medicine

## 2023-10-08 ENCOUNTER — Other Ambulatory Visit: Payer: Self-pay

## 2023-10-08 DIAGNOSIS — S161XXA Strain of muscle, fascia and tendon at neck level, initial encounter: Secondary | ICD-10-CM | POA: Diagnosis not present

## 2023-10-08 DIAGNOSIS — R519 Headache, unspecified: Secondary | ICD-10-CM | POA: Diagnosis not present

## 2023-10-08 DIAGNOSIS — M549 Dorsalgia, unspecified: Secondary | ICD-10-CM | POA: Diagnosis not present

## 2023-10-08 DIAGNOSIS — M542 Cervicalgia: Secondary | ICD-10-CM | POA: Diagnosis not present

## 2023-10-08 DIAGNOSIS — Y92410 Unspecified street and highway as the place of occurrence of the external cause: Secondary | ICD-10-CM | POA: Diagnosis not present

## 2023-10-08 DIAGNOSIS — G4489 Other headache syndrome: Secondary | ICD-10-CM | POA: Diagnosis not present

## 2023-10-08 DIAGNOSIS — S0990XA Unspecified injury of head, initial encounter: Secondary | ICD-10-CM | POA: Diagnosis not present

## 2023-10-08 DIAGNOSIS — R42 Dizziness and giddiness: Secondary | ICD-10-CM | POA: Diagnosis not present

## 2023-10-08 DIAGNOSIS — I10 Essential (primary) hypertension: Secondary | ICD-10-CM | POA: Diagnosis not present

## 2023-10-08 MED ORDER — OXYCODONE-ACETAMINOPHEN 5-325 MG PO TABS
1.0000 | ORAL_TABLET | Freq: Once | ORAL | Status: AC
  Administered 2023-10-08: 1 via ORAL
  Filled 2023-10-08: qty 1

## 2023-10-08 MED ORDER — IBUPROFEN 600 MG PO TABS: 600.0000 mg | ORAL_TABLET | Freq: Four times a day (QID) | ORAL | 0 refills | Status: DC | PRN

## 2023-10-08 MED ORDER — ACETAMINOPHEN 500 MG PO TABS: 500.0000 mg | ORAL_TABLET | Freq: Four times a day (QID) | ORAL | 0 refills | Status: DC | PRN

## 2023-10-08 MED ORDER — METHOCARBAMOL 500 MG PO TABS: 500.0000 mg | ORAL_TABLET | Freq: Two times a day (BID) | ORAL | 0 refills | Status: DC
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
## 2023-12-25 DIAGNOSIS — Z0000 Encounter for general adult medical examination without abnormal findings: Secondary | ICD-10-CM | POA: Diagnosis not present

## 2023-12-25 DIAGNOSIS — E6609 Other obesity due to excess calories: Secondary | ICD-10-CM | POA: Diagnosis not present

## 2023-12-25 DIAGNOSIS — L709 Acne, unspecified: Secondary | ICD-10-CM | POA: Diagnosis not present

## 2023-12-25 DIAGNOSIS — F419 Anxiety disorder, unspecified: Secondary | ICD-10-CM | POA: Diagnosis not present

## 2023-12-25 DIAGNOSIS — Z3200 Encounter for pregnancy test, result unknown: Secondary | ICD-10-CM | POA: Diagnosis not present

## 2023-12-25 DIAGNOSIS — F32A Depression, unspecified: Secondary | ICD-10-CM | POA: Diagnosis not present

## 2023-12-25 DIAGNOSIS — Z6830 Body mass index (BMI) 30.0-30.9, adult: Secondary | ICD-10-CM | POA: Diagnosis not present

## 2023-12-25 DIAGNOSIS — Z789 Other specified health status: Secondary | ICD-10-CM | POA: Diagnosis not present

## 2023-12-25 DIAGNOSIS — Z8619 Personal history of other infectious and parasitic diseases: Secondary | ICD-10-CM | POA: Diagnosis not present

## 2023-12-25 DIAGNOSIS — F17210 Nicotine dependence, cigarettes, uncomplicated: Secondary | ICD-10-CM | POA: Diagnosis not present

## 2024-02-21 DIAGNOSIS — F17210 Nicotine dependence, cigarettes, uncomplicated: Secondary | ICD-10-CM | POA: Diagnosis not present

## 2024-02-21 DIAGNOSIS — F419 Anxiety disorder, unspecified: Secondary | ICD-10-CM | POA: Diagnosis not present

## 2024-02-21 DIAGNOSIS — Z79899 Other long term (current) drug therapy: Secondary | ICD-10-CM | POA: Diagnosis not present

## 2024-02-21 DIAGNOSIS — Z8619 Personal history of other infectious and parasitic diseases: Secondary | ICD-10-CM | POA: Diagnosis not present

## 2024-02-21 DIAGNOSIS — Z6830 Body mass index (BMI) 30.0-30.9, adult: Secondary | ICD-10-CM | POA: Diagnosis not present

## 2024-02-21 DIAGNOSIS — Z789 Other specified health status: Secondary | ICD-10-CM | POA: Diagnosis not present

## 2024-02-21 DIAGNOSIS — Z0000 Encounter for general adult medical examination without abnormal findings: Secondary | ICD-10-CM | POA: Diagnosis not present

## 2024-02-21 DIAGNOSIS — L709 Acne, unspecified: Secondary | ICD-10-CM | POA: Diagnosis not present

## 2024-02-21 DIAGNOSIS — Z3200 Encounter for pregnancy test, result unknown: Secondary | ICD-10-CM | POA: Diagnosis not present

## 2024-02-21 DIAGNOSIS — F32A Depression, unspecified: Secondary | ICD-10-CM | POA: Diagnosis not present

## 2024-02-28 DIAGNOSIS — F32A Depression, unspecified: Secondary | ICD-10-CM | POA: Diagnosis not present

## 2024-02-28 DIAGNOSIS — Z6830 Body mass index (BMI) 30.0-30.9, adult: Secondary | ICD-10-CM | POA: Diagnosis not present

## 2024-02-28 DIAGNOSIS — F419 Anxiety disorder, unspecified: Secondary | ICD-10-CM | POA: Diagnosis not present

## 2024-02-28 DIAGNOSIS — L709 Acne, unspecified: Secondary | ICD-10-CM | POA: Diagnosis not present

## 2024-02-28 DIAGNOSIS — E6609 Other obesity due to excess calories: Secondary | ICD-10-CM | POA: Diagnosis not present

## 2024-02-28 DIAGNOSIS — Z0189 Encounter for other specified special examinations: Secondary | ICD-10-CM | POA: Diagnosis not present

## 2024-02-28 DIAGNOSIS — Z789 Other specified health status: Secondary | ICD-10-CM | POA: Diagnosis not present

## 2024-02-28 DIAGNOSIS — Z3200 Encounter for pregnancy test, result unknown: Secondary | ICD-10-CM | POA: Diagnosis not present

## 2024-04-07 ENCOUNTER — Ambulatory Visit

## 2024-04-08 ENCOUNTER — Other Ambulatory Visit (HOSPITAL_COMMUNITY)
Admission: RE | Admit: 2024-04-08 | Discharge: 2024-04-08 | Disposition: A | Discharge status: Home / Self Care | Source: Ambulatory Visit | Attending: Obstetrics and Gynecology | Admitting: Obstetrics and Gynecology

## 2024-04-08 ENCOUNTER — Ambulatory Visit

## 2024-04-08 VITALS — BP 113/75 | HR 87 | Wt 188.0 lb

## 2024-04-08 DIAGNOSIS — N898 Other specified noninflammatory disorders of vagina: Secondary | ICD-10-CM | POA: Insufficient documentation
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
## 2024-04-10 ENCOUNTER — Ambulatory Visit: Payer: Self-pay | Admitting: Obstetrics and Gynecology

## 2024-04-10 DIAGNOSIS — N898 Other specified noninflammatory disorders of vagina: Secondary | ICD-10-CM

## 2024-04-10 DIAGNOSIS — B9689 Other specified bacterial agents as the cause of diseases classified elsewhere: Secondary | ICD-10-CM

## 2024-04-10 LAB — CERVICOVAGINAL ANCILLARY ONLY
Bacterial Vaginitis (gardnerella): POSITIVE — AB
Candida Glabrata: NEGATIVE
Candida Vaginitis: NEGATIVE
Chlamydia: NEGATIVE
Comment: NEGATIVE
Comment: NEGATIVE
Comment: NEGATIVE
Comment: NEGATIVE
Comment: NEGATIVE
Comment: NORMAL
Neisseria Gonorrhea: NEGATIVE
Trichomonas: NEGATIVE

## 2024-04-10 MED ORDER — METRONIDAZOLE 500 MG PO TABS: 500.0000 mg | ORAL_TABLET | Freq: Two times a day (BID) | ORAL | 0 refills | Status: DC

## 2024-04-11 MED ORDER — FLUCONAZOLE 150 MG PO TABS: 150.0000 mg | ORAL_TABLET | Freq: Once | ORAL | 0 refills | Status: AC

## 2024-06-17 ENCOUNTER — Other Ambulatory Visit: Payer: Self-pay | Admitting: Obstetrics and Gynecology

## 2024-06-17 DIAGNOSIS — B9689 Other specified bacterial agents as the cause of diseases classified elsewhere: Secondary | ICD-10-CM

## 2024-06-17 DIAGNOSIS — N898 Other specified noninflammatory disorders of vagina: Secondary | ICD-10-CM

## 2024-06-19 ENCOUNTER — Other Ambulatory Visit (HOSPITAL_COMMUNITY)
Admission: RE | Admit: 2024-06-19 | Discharge: 2024-06-19 | Disposition: A | Discharge status: Home / Self Care | Source: Ambulatory Visit | Attending: Obstetrics and Gynecology | Admitting: Obstetrics and Gynecology

## 2024-06-19 ENCOUNTER — Ambulatory Visit (INDEPENDENT_AMBULATORY_CARE_PROVIDER_SITE_OTHER)

## 2024-06-19 VITALS — BP 127/81 | HR 75 | Ht 65.0 in | Wt 198.0 lb

## 2024-06-19 DIAGNOSIS — N898 Other specified noninflammatory disorders of vagina: Secondary | ICD-10-CM

## 2024-06-19 NOTE — Progress Notes (Signed)
 SUBJECTIVE:  28 y.o. female complains of vaginal irritation, creamy vaginal discharge for 5 day(s). Denies abnormal vaginal bleeding or significant pelvic pain or fever. No UTI symptoms. Denies history of known exposure to STD.  Patient's last menstrual period was 05/29/2024 (exact date).  OBJECTIVE:  She appears well, afebrile. Urine dipstick: not done.  ASSESSMENT:  Vaginal Discharge  Vaginal Odor   PLAN:  GC, chlamydia, trichomonas, BVAG, CVAG probe sent to lab. Treatment: To be determined once lab results are received ROV prn if symptoms persist or worsen.

## 2024-06-20 LAB — CERVICOVAGINAL ANCILLARY ONLY
Bacterial Vaginitis (gardnerella): NEGATIVE
Candida Glabrata: NEGATIVE
Candida Vaginitis: POSITIVE — AB
Chlamydia: NEGATIVE
Comment: NEGATIVE
Comment: NEGATIVE
Comment: NEGATIVE
Comment: NEGATIVE
Comment: NEGATIVE
Comment: NORMAL
Neisseria Gonorrhea: NEGATIVE
Trichomonas: NEGATIVE

## 2024-06-23 ENCOUNTER — Ambulatory Visit: Payer: Self-pay | Admitting: Obstetrics and Gynecology

## 2024-06-23 DIAGNOSIS — B3731 Acute candidiasis of vulva and vagina: Secondary | ICD-10-CM

## 2024-06-23 MED ORDER — FLUCONAZOLE 150 MG PO TABS: 150.0000 mg | ORAL_TABLET | Freq: Once | ORAL | 0 refills | Status: AC

## 2024-08-21 ENCOUNTER — Ambulatory Visit (HOSPITAL_COMMUNITY): Admission: EM | Admit: 2024-08-21 | Discharge: 2024-08-21 | Disposition: A | Discharge status: Home / Self Care

## 2024-08-21 ENCOUNTER — Encounter (HOSPITAL_COMMUNITY): Payer: Self-pay

## 2024-08-21 DIAGNOSIS — T3695XA Adverse effect of unspecified systemic antibiotic, initial encounter: Secondary | ICD-10-CM | POA: Diagnosis not present

## 2024-08-21 DIAGNOSIS — Z3202 Encounter for pregnancy test, result negative: Secondary | ICD-10-CM

## 2024-08-21 DIAGNOSIS — N760 Acute vaginitis: Secondary | ICD-10-CM | POA: Diagnosis not present

## 2024-08-21 DIAGNOSIS — B379 Candidiasis, unspecified: Secondary | ICD-10-CM | POA: Diagnosis not present

## 2024-08-21 LAB — POCT URINE PREGNANCY: Preg Test, Ur: NEGATIVE

## 2024-08-21 MED ORDER — FLUCONAZOLE 150 MG PO TABS: 150.0000 mg | ORAL_TABLET | ORAL | 0 refills | Status: AC

## 2024-08-21 MED ORDER — METRONIDAZOLE 500 MG PO TABS: 500.0000 mg | ORAL_TABLET | Freq: Two times a day (BID) | ORAL | 0 refills | Status: AC

## 2024-08-21 NOTE — Discharge Instructions (Addendum)
 Your evaluation suggests that you likely have bacterial vaginitis.  Your vaginal testing is pending and will come back in the next 2-3 days. You will receive a phone call from staff if you test positive for any other infections. We will go ahead and start treatment for BV. Take antibiotic as prescribed to treat this. If you were given flagyl  pills, do not drink alcohol within 72 hours of taking this medication as this can cause severe nausea and vomiting.   Take 1 pill of Diflucan  today, then take the next pill on day 7 of your antibiotic to prevent antibiotic induced vaginal yeast infection.  Follow-up with OB/GYN or return to urgent care as needed.

## 2024-08-21 NOTE — ED Triage Notes (Signed)
 Patient having vaginal discharge and bad odor onset last night. Denies any pain or urinary symptoms. Denies any new products used.

## 2024-08-21 NOTE — ED Provider Notes (Addendum)
 [facility]    CSN: [id number] Arrival date & time: 08/21/24  1017      History   Chief Complaint Chief Complaint  Patient presents with   Vaginal Discharge    HPI Lisa Cook is a 28 y.o. female.   Lisa Cook is a 28 y.o. female presenting for chief complaint of Vaginal Discharge and vaginal odor that started last night.  Vaginal odor smells like a fishy odor and vaginal discharge is white, thin, and runny.  She denies associated vaginal itching, urinary symptoms, vaginal rash, pelvic pain, abdominal pain, low back pain, flank pain, fever, and chills.  No nausea, vomiting, or malaise.  History of BV, states this feels very similar.  She additionally states that she experiences vaginal yeast infections as a result of taking antibiotics and requests Diflucan  should we decide to treat empirically for BV today.  Denies recent antibiotics or steroid use in the last 90 days.  She uses Dove soap and believes that the recent soap bar that she purchased may have a different fragrance which may have triggered her BV.  She has not attempted use of any over-the-counter medications to help with symptoms prior to arrival.  LMP 08/03/2024.  No concern for STD, she has not been sexually active in the last 4 months.   Vaginal Discharge   Past Medical History:  Diagnosis Date   Decreased appetite 11/29/2012   Inguinal hernia 11/2012   right   Inguinal hernia    Medical history non-contributory     Patient Active Problem List   Diagnosis Date Noted   Shoulder dystocia, delivered 01/09/2020   NSVD (normal spontaneous vaginal delivery)    Encounter for induction of labor 01/08/2020   Pre-eclampsia, severe 01/08/2020   Pediatric pre-birth visit for expectant parent 12/16/2019   Supervision of low-risk pregnancy 07/08/2019    Past Surgical History:  Procedure Laterality Date   INDUCED ABORTION  2023   INGUINAL HERNIA REPAIR Right 12/05/2012   Procedure: RIGHT  INGUINAL HERNIA REPAIR WITH LAP LOOK ON LEFT SIDE FOR POSSIBLE REPAIR;  Surgeon: CHRISTELLA. Julietta Millman, MD;  Location: [facility];  Service: Pediatrics;  Laterality: Right;  Rigth inguinal hernia repair with laparoscopic look on left (no hernia)   INGUINAL HERNIA REPAIR  2014    OB History     Gravida  4   Para  1   Term  0   Preterm  1   AB  3   Living  1      SAB  1   IAB  2   Ectopic  0   Multiple  0   Live Births  1            Home Medications    Prior to Admission medications   Medication Sig Start Date End Date Taking? Authorizing Provider  amLODipine (NORVASC) 5 MG tablet Take 5 mg by mouth daily. 06/19/24  Yes [provider]  fluconazole  (DIFLUCAN ) 150 MG tablet Take 1 tablet (150 mg total) by mouth every 7 (seven) days. 08/21/24  Yes Enedelia Dorna CHRISTELLA, FNP  metroNIDAZOLE  (FLAGYL ) 500 MG tablet Take 1 tablet (500 mg total) by mouth 2 (two) times daily. 08/21/24  Yes Enedelia Dorna CHRISTELLA, FNP    Family History Family History  Problem Relation Age of Onset   Diabetes Maternal Aunt    Diabetes Maternal Grandmother    Hypertension Maternal Grandmother    Heart disease Maternal Grandmother    Kidney  disease Maternal Grandmother        renal failure   Asthma Maternal Grandmother    Asthma Brother    Sickle cell trait Brother    Sickle cell trait Sister     Social History Social History   Tobacco Use   Smoking status: Never   Smokeless tobacco: Never  Vaping Use   Vaping status: Never Used  Substance Use Topics   Alcohol use: Never    Comment: occasionally   Drug use: Not Currently    Types: Marijuana    Comment: Last Mid-March     Allergies   Patient has no known allergies.   Review of Systems Review of Systems  Genitourinary:  Positive for vaginal discharge.  Per HPI   Physical Exam Triage Vital Signs ED Triage Vitals  Encounter Vitals Group     BP 08/21/24 1027 (!) 134/99     Girls Systolic BP  Percentile --      Girls Diastolic BP Percentile --      Boys Systolic BP Percentile --      Boys Diastolic BP Percentile --      Pulse Rate 08/21/24 1027 71     Resp 08/21/24 1027 16     Temp 08/21/24 1027 98.4 F (36.9 C)     Temp Source 08/21/24 1027 Oral     SpO2 08/21/24 1027 99 %     Weight --      Height 08/21/24 1026 5' 5 (1.651 m)     Head Circumference --      Peak Flow --      Pain Score 08/21/24 1025 0     Pain Loc --      Pain Education --      Exclude from Growth Chart --    No data found.  Updated Vital Signs BP (!) 134/99 (BP Location: Left Arm)   Pulse 71   Temp 98.4 F (36.9 C) (Oral)   Resp 16   Ht 5' 5 (1.651 m)   SpO2 99%   BMI 32.95 kg/m   Visual Acuity Right Eye Distance:   Left Eye Distance:   Bilateral Distance:    Right Eye Near:   Left Eye Near:    Bilateral Near:     Physical Exam Vitals and nursing note reviewed.  Constitutional:      Appearance: She is not ill-appearing or toxic-appearing.  HENT:     Head: Normocephalic and atraumatic.     Right Ear: Hearing and external ear normal.     Left Ear: Hearing and external ear normal.     Nose: Nose normal.     Mouth/Throat:     Lips: Pink.  Eyes:     General: Lids are normal. Vision grossly intact. Gaze aligned appropriately.     Extraocular Movements: Extraocular movements intact.     Conjunctiva/sclera: Conjunctivae normal.  Pulmonary:     Effort: Pulmonary effort is normal.  Musculoskeletal:     Cervical back: Neck supple.  Skin:    General: Skin is warm and dry.     Capillary Refill: Capillary refill takes less than 2 seconds.     Findings: No rash.  Neurological:     General: No focal deficit present.     Mental Status: She is alert and oriented to person, place, and time. Mental status is at baseline.     Cranial Nerves: No dysarthria or facial asymmetry.  Psychiatric:        Mood and  Affect: Mood normal.        Speech: Speech normal.        Behavior: Behavior  normal.        Thought Content: Thought content normal.        Judgment: Judgment normal.      UC Treatments / Results  Labs (all labs ordered are listed, but only abnormal results are displayed) Labs Reviewed  POCT URINE PREGNANCY  CERVICOVAGINAL ANCILLARY ONLY    EKG   Radiology No results found.  Procedures Procedures (including critical care time)  Medications Ordered in UC Medications - No data to display  Initial Impression / Assessment and Plan / UC Course  I have reviewed the triage vital signs and the nursing notes.  Pertinent labs & imaging results that were available during my care of the patient were reviewed by me and considered in my medical decision making (see chart for details).   1. Acute vaginitis, negative pregnancy test, antibiotic induced yeast infection BV and yeast labs pending, no concern for STI. Will go ahead and treat with Flagyl  for BV given symptoms and history of same.  Discussed risk of disulfiram reaction with use of alcohol within 72 hours of Flagyl  administration. Antibiotic induced yeast infection history-Diflucan  prescribed per patient request. Discussed tips for BV/yeast prevention in the future. Recommend follow-up with OB/GYN PRN.    Urine pregnancy test negative.   Counseled patient on potential for adverse effects with medications prescribed/recommended today, strict ER and return-to-clinic precautions discussed, patient verbalized understanding.    Final Clinical Impressions(s) / UC Diagnoses   Final diagnoses:  Acute vaginitis  Negative pregnancy test  Antibiotic-induced yeast infection     Discharge Instructions      Your evaluation suggests that you likely have bacterial vaginitis.  Your vaginal testing is pending and will come back in the next 2-3 days. You will receive a phone call from staff if you test positive for any other infections. We will go ahead and start treatment for BV. Take antibiotic as  prescribed to treat this. If you were given flagyl  pills, do not drink alcohol within 72 hours of taking this medication as this can cause severe nausea and vomiting.   Take 1 pill of Diflucan  today, then take the next pill on day 7 of your antibiotic to prevent antibiotic induced vaginal yeast infection.  Follow-up with OB/GYN or return to urgent care as needed.       ED Prescriptions     Medication Sig Dispense Auth. Provider   metroNIDAZOLE  (FLAGYL ) 500 MG tablet Take 1 tablet (500 mg total) by mouth 2 (two) times daily. 14 tablet Enedelia Dorna HERO, FNP   fluconazole  (DIFLUCAN ) 150 MG tablet Take 1 tablet (150 mg total) by mouth every 7 (seven) days. 2 tablet Enedelia Dorna HERO, FNP      PDMP not reviewed this encounter.   Enedelia Dorna HERO, FNP 08/21/24 1056    Enedelia Dorna [place], [place] 08/21/24 1056

## 2024-08-25 ENCOUNTER — Ambulatory Visit (HOSPITAL_COMMUNITY): Payer: Self-pay

## 2024-08-25 LAB — CERVICOVAGINAL ANCILLARY ONLY
Bacterial Vaginitis (gardnerella): POSITIVE — AB
Candida Glabrata: NEGATIVE
Candida Vaginitis: NEGATIVE
Comment: NEGATIVE
Comment: NEGATIVE
Comment: NEGATIVE

## 2024-08-26 ENCOUNTER — Ambulatory Visit
